# Patient Record
Sex: Male | Born: 1935
Health system: Southern US, Community
[De-identification: ages and names within clinical notes are randomized; demographics above are authoritative.]

## PROBLEM LIST (undated history)

## (undated) DIAGNOSIS — N400 Enlarged prostate without lower urinary tract symptoms: Secondary | ICD-10-CM

## (undated) DIAGNOSIS — H9190 Unspecified hearing loss, unspecified ear: Secondary | ICD-10-CM

## (undated) DIAGNOSIS — S7290XA Unspecified fracture of unspecified femur, initial encounter for closed fracture: Secondary | ICD-10-CM

## (undated) DIAGNOSIS — C61 Malignant neoplasm of prostate: Secondary | ICD-10-CM

## (undated) DIAGNOSIS — E785 Hyperlipidemia, unspecified: Secondary | ICD-10-CM

## (undated) DIAGNOSIS — N189 Chronic kidney disease, unspecified: Secondary | ICD-10-CM

## (undated) DIAGNOSIS — I10 Essential (primary) hypertension: Secondary | ICD-10-CM

## (undated) HISTORY — DX: Essential (primary) hypertension: I10

## (undated) HISTORY — PX: TONSILLECTOMY: SUR1361

## (undated) HISTORY — PX: CYSTOSCOPY: SUR368

## (undated) HISTORY — PX: PROSTATECTOMY: SHX69

## (undated) HISTORY — DX: Unspecified fracture of unspecified femur, initial encounter for closed fracture: S72.90XA

## (undated) HISTORY — PX: VERTEBROPLASTY: SHX113

## (undated) HISTORY — DX: Hyperlipidemia, unspecified: E78.5

## (undated) HISTORY — DX: Unspecified hearing loss, unspecified ear: H91.90

## (undated) HISTORY — DX: Chronic kidney disease, unspecified: N18.9

## (undated) HISTORY — PX: ORIF HIP FRACTURE: SHX2125

## (undated) HISTORY — DX: Malignant neoplasm of prostate: C61

## (undated) HISTORY — DX: Benign prostatic hyperplasia without lower urinary tract symptoms: N40.0

## (undated) HISTORY — PX: APPENDECTOMY: SHX54

---

## 1997-01-22 DIAGNOSIS — C61 Malignant neoplasm of prostate: Secondary | ICD-10-CM

## 1997-01-22 HISTORY — DX: Malignant neoplasm of prostate: C61

## 1997-08-03 ENCOUNTER — Other Ambulatory Visit: Admission: RE | Admit: 1997-08-03 | Discharge: 1997-08-03 | Payer: Self-pay | Admitting: Urology

## 1997-08-09 ENCOUNTER — Encounter: Admission: RE | Admit: 1997-08-09 | Discharge: 1997-11-07 | Payer: Self-pay | Admitting: *Deleted

## 1997-09-08 ENCOUNTER — Encounter: Payer: Self-pay | Admitting: Urology

## 1997-09-14 ENCOUNTER — Ambulatory Visit (HOSPITAL_COMMUNITY): Admission: RE | Admit: 1997-09-14 | Discharge: 1997-09-15 | Payer: Self-pay | Admitting: Urology

## 1997-09-14 ENCOUNTER — Encounter: Payer: Self-pay | Admitting: Urology

## 1999-09-15 ENCOUNTER — Other Ambulatory Visit: Admission: RE | Admit: 1999-09-15 | Discharge: 1999-09-15 | Payer: Self-pay | Admitting: Internal Medicine

## 1999-09-15 ENCOUNTER — Encounter (INDEPENDENT_AMBULATORY_CARE_PROVIDER_SITE_OTHER): Payer: Self-pay | Admitting: Specialist

## 2001-11-12 ENCOUNTER — Inpatient Hospital Stay (HOSPITAL_COMMUNITY): Admission: EM | Admit: 2001-11-12 | Discharge: 2001-11-16 | Payer: Self-pay | Admitting: Emergency Medicine

## 2001-11-12 ENCOUNTER — Encounter: Payer: Self-pay | Admitting: Family Medicine

## 2001-11-13 ENCOUNTER — Encounter: Payer: Self-pay | Admitting: Internal Medicine

## 2003-11-18 ENCOUNTER — Encounter: Admission: RE | Admit: 2003-11-18 | Discharge: 2003-11-18 | Payer: Self-pay | Admitting: Family Medicine

## 2003-11-29 ENCOUNTER — Ambulatory Visit: Payer: Self-pay | Admitting: Cardiology

## 2003-11-30 ENCOUNTER — Ambulatory Visit: Payer: Self-pay | Admitting: Family Medicine

## 2004-01-23 LAB — HM COLONOSCOPY

## 2004-01-27 ENCOUNTER — Ambulatory Visit: Payer: Self-pay | Admitting: Family Medicine

## 2004-03-10 ENCOUNTER — Ambulatory Visit: Payer: Self-pay | Admitting: Family Medicine

## 2004-06-06 ENCOUNTER — Encounter: Admission: RE | Admit: 2004-06-06 | Discharge: 2004-06-06 | Payer: Self-pay | Admitting: Urology

## 2004-06-12 ENCOUNTER — Ambulatory Visit (HOSPITAL_BASED_OUTPATIENT_CLINIC_OR_DEPARTMENT_OTHER): Admission: RE | Admit: 2004-06-12 | Discharge: 2004-06-12 | Payer: Self-pay | Admitting: Urology

## 2004-06-12 ENCOUNTER — Ambulatory Visit (HOSPITAL_COMMUNITY): Admission: RE | Admit: 2004-06-12 | Discharge: 2004-06-12 | Payer: Self-pay | Admitting: Urology

## 2004-11-22 ENCOUNTER — Ambulatory Visit: Payer: Self-pay | Admitting: Family Medicine

## 2004-11-22 LAB — FECAL OCCULT BLOOD, GUAIAC: Fecal Occult Blood: NEGATIVE

## 2004-11-22 LAB — CONVERTED CEMR LAB

## 2004-12-20 ENCOUNTER — Ambulatory Visit: Payer: Self-pay | Admitting: Family Medicine

## 2005-05-04 ENCOUNTER — Ambulatory Visit: Payer: Self-pay | Admitting: Family Medicine

## 2005-05-11 ENCOUNTER — Ambulatory Visit: Payer: Self-pay | Admitting: Family Medicine

## 2005-05-18 ENCOUNTER — Ambulatory Visit: Payer: Self-pay | Admitting: Family Medicine

## 2005-11-12 ENCOUNTER — Ambulatory Visit: Payer: Self-pay | Admitting: Family Medicine

## 2005-11-12 LAB — CONVERTED CEMR LAB
ALT: 19 units/L (ref 0–40)
AST: 18 units/L (ref 0–37)
Alkaline Phosphatase: 69 units/L (ref 39–117)
BUN: 23 mg/dL (ref 6–23)
Basophils Absolute: 0 10*3/uL (ref 0.0–0.1)
Basophils Relative: 0.6 % (ref 0.0–1.0)
Chol/HDL Ratio, serum: 5.4
Cholesterol: 165 mg/dL (ref 0–200)
Creatinine, Ser: 1.2 mg/dL (ref 0.4–1.5)
Eosinophil percent: 6.1 % — ABNORMAL HIGH (ref 0.0–5.0)
Glucose, Bld: 95 mg/dL (ref 70–99)
HCT: 43.9 % (ref 39.0–52.0)
HDL: 30.5 mg/dL — ABNORMAL LOW (ref >39.0)
Hemoglobin: 15.1 g/dL (ref 13.0–17.0)
LDL Cholesterol: 113 mg/dL — ABNORMAL HIGH (ref 0–99)
Lymphocytes Relative: 33.3 % (ref 12.0–46.0)
MCHC: 34.4 g/dL (ref 30.0–36.0)
MCV: 89.9 fL (ref 78.0–100.0)
Monocytes Absolute: 0.8 10*3/uL — ABNORMAL HIGH (ref 0.2–0.7)
Monocytes Relative: 11.5 % — ABNORMAL HIGH (ref 3.0–11.0)
Neutro Abs: 3.2 10*3/uL (ref 1.4–7.7)
Neutrophils Relative %: 48.5 % (ref 43.0–77.0)
Platelets: 263 10*3/uL (ref 150–400)
Potassium: 4.1 meq/L (ref 3.5–5.1)
RBC: 4.88 M/uL (ref 4.22–5.81)
RDW: 13.3 % (ref 11.5–14.6)
TSH: 2.35 microintl units/mL (ref 0.35–5.50)
Triglyceride fasting, serum: 107 mg/dL (ref 0–149)
VLDL: 21 mg/dL (ref 0–40)
WBC: 6.6 10*3/uL (ref 4.5–10.5)

## 2005-11-19 ENCOUNTER — Ambulatory Visit: Payer: Self-pay | Admitting: Family Medicine

## 2005-11-19 LAB — CONVERTED CEMR LAB
BUN: 17 mg/dL (ref 6–23)
Creatinine, Ser: 1.4 mg/dL (ref 0.4–1.5)
Potassium: 4.4 meq/L (ref 3.5–5.1)

## 2006-10-16 ENCOUNTER — Encounter: Payer: Self-pay | Admitting: Family Medicine

## 2006-10-16 DIAGNOSIS — N401 Enlarged prostate with lower urinary tract symptoms: Secondary | ICD-10-CM

## 2006-10-16 DIAGNOSIS — I499 Cardiac arrhythmia, unspecified: Secondary | ICD-10-CM | POA: Insufficient documentation

## 2006-10-16 DIAGNOSIS — I10 Essential (primary) hypertension: Secondary | ICD-10-CM

## 2006-10-16 DIAGNOSIS — L989 Disorder of the skin and subcutaneous tissue, unspecified: Secondary | ICD-10-CM | POA: Insufficient documentation

## 2006-10-16 DIAGNOSIS — Z8546 Personal history of malignant neoplasm of prostate: Secondary | ICD-10-CM | POA: Insufficient documentation

## 2006-10-16 DIAGNOSIS — R351 Nocturia: Secondary | ICD-10-CM

## 2006-11-29 ENCOUNTER — Ambulatory Visit: Payer: Self-pay | Admitting: Family Medicine

## 2006-11-29 LAB — CONVERTED CEMR LAB
ALT: 16 units/L (ref 0–53)
AST: 18 units/L (ref 0–37)
Albumin: 3.7 g/dL (ref 3.5–5.2)
Alkaline Phosphatase: 77 units/L (ref 39–117)
BUN: 27 mg/dL — ABNORMAL HIGH (ref 6–23)
Basophils Absolute: 0.1 10*3/uL (ref 0.0–0.1)
Basophils Relative: 1 % (ref 0.0–1.0)
Bilirubin Urine: NEGATIVE
Bilirubin, Direct: 0.1 mg/dL (ref 0.0–0.3)
Blood in Urine, dipstick: NEGATIVE
CO2: 29 meq/L (ref 19–32)
Calcium: 9.7 mg/dL (ref 8.4–10.5)
Chloride: 108 meq/L (ref 96–112)
Cholesterol: 172 mg/dL (ref 0–200)
Creatinine, Ser: 1.6 mg/dL — ABNORMAL HIGH (ref 0.4–1.5)
Eosinophils Absolute: 0.5 10*3/uL (ref 0.0–0.6)
Eosinophils Relative: 6 % — ABNORMAL HIGH (ref 0.0–5.0)
GFR calc Af Amer: 53 mL/min
GFR calc non Af Amer: 44 mL/min
Glucose, Bld: 133 mg/dL — ABNORMAL HIGH (ref 70–99)
Glucose, Urine, Semiquant: NEGATIVE
HCT: 43.3 % (ref 39.0–52.0)
HDL: 25.3 mg/dL — ABNORMAL LOW (ref >39.0)
Hemoglobin: 15 g/dL (ref 13.0–17.0)
Ketones, urine, test strip: NEGATIVE
LDL Cholesterol: 108 mg/dL — ABNORMAL HIGH (ref 0–99)
Lymphocytes Relative: 31.6 % (ref 12.0–46.0)
MCHC: 34.5 g/dL (ref 30.0–36.0)
MCV: 91.4 fL (ref 78.0–100.0)
Monocytes Absolute: 1 10*3/uL — ABNORMAL HIGH (ref 0.2–0.7)
Monocytes Relative: 12.1 % — ABNORMAL HIGH (ref 3.0–11.0)
Neutro Abs: 4.1 10*3/uL (ref 1.4–7.7)
Neutrophils Relative %: 49.3 % (ref 43.0–77.0)
Nitrite: NEGATIVE
PSA: 0.03 ng/mL — ABNORMAL LOW (ref 0.10–4.00)
Platelets: 255 10*3/uL (ref 150–400)
Potassium: 4.5 meq/L (ref 3.5–5.1)
Protein, U semiquant: NEGATIVE
RBC: 4.74 M/uL (ref 4.22–5.81)
RDW: 13.1 % (ref 11.5–14.6)
Sodium: 145 meq/L (ref 135–145)
Specific Gravity, Urine: 1.015
TSH: 2.42 microintl units/mL (ref 0.35–5.50)
Total Bilirubin: 1 mg/dL (ref 0.3–1.2)
Total CHOL/HDL Ratio: 6.8
Total Protein: 6.6 g/dL (ref 6.0–8.3)
Triglycerides: 194 mg/dL — ABNORMAL HIGH (ref 0–149)
Urobilinogen, UA: 0.2
VLDL: 39 mg/dL (ref 0–40)
WBC Urine, dipstick: NEGATIVE
WBC: 8.4 10*3/uL (ref 4.5–10.5)
pH: 5

## 2006-12-04 ENCOUNTER — Encounter: Payer: Self-pay | Admitting: Family Medicine

## 2007-01-27 ENCOUNTER — Ambulatory Visit: Payer: Self-pay | Admitting: Family Medicine

## 2007-10-23 ENCOUNTER — Ambulatory Visit: Payer: Self-pay | Admitting: Family Medicine

## 2007-12-02 ENCOUNTER — Ambulatory Visit: Payer: Self-pay | Admitting: Family Medicine

## 2008-01-28 ENCOUNTER — Encounter: Payer: Self-pay | Admitting: Family Medicine

## 2008-01-29 ENCOUNTER — Ambulatory Visit: Payer: Self-pay | Admitting: Family Medicine

## 2008-01-29 DIAGNOSIS — M109 Gout, unspecified: Secondary | ICD-10-CM

## 2008-02-02 LAB — CONVERTED CEMR LAB
ALT: 14 units/L (ref 0–53)
AST: 19 units/L (ref 0–37)
Albumin: 3.5 g/dL (ref 3.5–5.2)
Alkaline Phosphatase: 80 units/L (ref 39–117)
BUN: 22 mg/dL (ref 6–23)
Basophils Absolute: 0.1 10*3/uL (ref 0.0–0.1)
Basophils Relative: 0.9 % (ref 0.0–3.0)
Bilirubin, Direct: 0.1 mg/dL (ref 0.0–0.3)
CO2: 29 meq/L (ref 19–32)
Calcium: 9.3 mg/dL (ref 8.4–10.5)
Chloride: 109 meq/L (ref 96–112)
Cholesterol: 169 mg/dL (ref 0–200)
Creatinine, Ser: 1.5 mg/dL (ref 0.4–1.5)
Eosinophils Absolute: 1.2 10*3/uL — ABNORMAL HIGH (ref 0.0–0.7)
Eosinophils Relative: 12 % — ABNORMAL HIGH (ref 0.0–5.0)
GFR calc Af Amer: 57 mL/min
GFR calc non Af Amer: 47 mL/min
Glucose, Bld: 111 mg/dL — ABNORMAL HIGH (ref 70–99)
HCT: 43.1 % (ref 39.0–52.0)
HDL: 29.8 mg/dL — ABNORMAL LOW (ref >39.0)
Hemoglobin: 14.9 g/dL (ref 13.0–17.0)
LDL Cholesterol: 111 mg/dL — ABNORMAL HIGH (ref 0–99)
Lymphocytes Relative: 22 % (ref 12.0–46.0)
MCHC: 34.5 g/dL (ref 30.0–36.0)
MCV: 91.7 fL (ref 78.0–100.0)
Monocytes Absolute: 1.3 10*3/uL — ABNORMAL HIGH (ref 0.1–1.0)
Monocytes Relative: 13.5 % — ABNORMAL HIGH (ref 3.0–12.0)
Neutro Abs: 5.1 10*3/uL (ref 1.4–7.7)
Neutrophils Relative %: 51.6 % (ref 43.0–77.0)
Platelets: 286 10*3/uL (ref 150–400)
Potassium: 4.1 meq/L (ref 3.5–5.1)
RBC: 4.7 M/uL (ref 4.22–5.81)
RDW: 13.4 % (ref 11.5–14.6)
Sodium: 147 meq/L — ABNORMAL HIGH (ref 135–145)
TSH: 2.67 microintl units/mL (ref 0.35–5.50)
Total Bilirubin: 0.7 mg/dL (ref 0.3–1.2)
Total CHOL/HDL Ratio: 5.7
Total Protein: 6.9 g/dL (ref 6.0–8.3)
Triglycerides: 142 mg/dL (ref 0–149)
Uric Acid, Serum: 10.1 mg/dL — ABNORMAL HIGH (ref 4.0–7.8)
VLDL: 28 mg/dL (ref 0–40)
WBC: 9.9 10*3/uL (ref 4.5–10.5)

## 2008-10-21 ENCOUNTER — Ambulatory Visit: Payer: Self-pay | Admitting: Family Medicine

## 2009-01-31 ENCOUNTER — Ambulatory Visit: Payer: Self-pay | Admitting: Family Medicine

## 2009-03-03 ENCOUNTER — Encounter
Admission: RE | Admit: 2009-03-03 | Discharge: 2009-03-03 | Payer: Self-pay | Admitting: Physical Medicine and Rehabilitation

## 2009-03-08 ENCOUNTER — Encounter
Admission: RE | Admit: 2009-03-08 | Discharge: 2009-03-08 | Payer: Self-pay | Admitting: Physical Medicine and Rehabilitation

## 2009-03-18 ENCOUNTER — Encounter: Payer: Self-pay | Admitting: Family Medicine

## 2009-03-26 ENCOUNTER — Emergency Department (HOSPITAL_COMMUNITY)
Admission: EM | Admit: 2009-03-26 | Discharge: 2009-03-26 | Payer: Self-pay | Source: Home / Self Care | Admitting: Emergency Medicine

## 2009-04-04 ENCOUNTER — Inpatient Hospital Stay (HOSPITAL_COMMUNITY): Admission: EM | Admit: 2009-04-04 | Discharge: 2009-04-08 | Payer: Self-pay | Admitting: Emergency Medicine

## 2009-04-11 ENCOUNTER — Encounter: Payer: Self-pay | Admitting: Emergency Medicine

## 2009-04-12 ENCOUNTER — Inpatient Hospital Stay (HOSPITAL_COMMUNITY): Admission: EM | Admit: 2009-04-12 | Discharge: 2009-04-18 | Payer: Self-pay

## 2009-04-12 ENCOUNTER — Ambulatory Visit: Payer: Self-pay | Admitting: Cardiology

## 2009-06-02 DIAGNOSIS — N200 Calculus of kidney: Secondary | ICD-10-CM | POA: Insufficient documentation

## 2009-06-02 DIAGNOSIS — E785 Hyperlipidemia, unspecified: Secondary | ICD-10-CM

## 2009-06-02 DIAGNOSIS — N19 Unspecified kidney failure: Secondary | ICD-10-CM | POA: Insufficient documentation

## 2009-06-03 ENCOUNTER — Ambulatory Visit: Payer: Self-pay | Admitting: Cardiology

## 2009-06-08 ENCOUNTER — Telehealth (INDEPENDENT_AMBULATORY_CARE_PROVIDER_SITE_OTHER): Payer: Self-pay | Admitting: *Deleted

## 2009-06-09 ENCOUNTER — Ambulatory Visit: Payer: Self-pay

## 2009-06-09 ENCOUNTER — Ambulatory Visit: Payer: Self-pay | Admitting: Internal Medicine

## 2009-06-09 ENCOUNTER — Encounter (HOSPITAL_COMMUNITY): Admission: RE | Admit: 2009-06-09 | Discharge: 2009-08-23 | Payer: Self-pay | Admitting: Cardiology

## 2009-08-17 ENCOUNTER — Encounter: Payer: Self-pay | Admitting: Cardiology

## 2009-08-17 ENCOUNTER — Ambulatory Visit: Payer: Self-pay | Admitting: Cardiology

## 2009-11-07 ENCOUNTER — Ambulatory Visit: Payer: Self-pay | Admitting: Family Medicine

## 2010-02-10 ENCOUNTER — Ambulatory Visit
Admission: RE | Admit: 2010-02-10 | Discharge: 2010-02-10 | Payer: Self-pay | Source: Home / Self Care | Attending: Family Medicine | Admitting: Family Medicine

## 2010-02-10 ENCOUNTER — Other Ambulatory Visit: Payer: Self-pay | Admitting: Family Medicine

## 2010-02-10 LAB — CBC WITH DIFFERENTIAL/PLATELET
Basophils Absolute: 0.1 10*3/uL (ref 0.0–0.1)
Basophils Relative: 0.9 % (ref 0.0–3.0)
Eosinophils Absolute: 0.5 10*3/uL (ref 0.0–0.7)
Eosinophils Relative: 5.7 % — ABNORMAL HIGH (ref 0.0–5.0)
HCT: 43.8 % (ref 39.0–52.0)
Hemoglobin: 14.7 g/dL (ref 13.0–17.0)
Lymphocytes Relative: 27.8 % (ref 12.0–46.0)
Lymphs Abs: 2.3 10*3/uL (ref 0.7–4.0)
MCHC: 33.7 g/dL (ref 30.0–36.0)
MCV: 93.7 fl (ref 78.0–100.0)
Monocytes Absolute: 0.8 10*3/uL (ref 0.1–1.0)
Monocytes Relative: 9.4 % (ref 3.0–12.0)
Neutro Abs: 4.6 10*3/uL (ref 1.4–7.7)
Neutrophils Relative %: 56.2 % (ref 43.0–77.0)
Platelets: 238 10*3/uL (ref 150.0–400.0)
RBC: 4.68 Mil/uL (ref 4.22–5.81)
RDW: 14.3 % (ref 11.5–14.6)
WBC: 8.2 10*3/uL (ref 4.5–10.5)

## 2010-02-10 LAB — HEPATIC FUNCTION PANEL
ALT: 14 U/L (ref 0–53)
AST: 18 U/L (ref 0–37)
Albumin: 3.8 g/dL (ref 3.5–5.2)
Alkaline Phosphatase: 86 U/L (ref 39–117)
Bilirubin, Direct: 0.1 mg/dL (ref 0.0–0.3)
Total Bilirubin: 0.6 mg/dL (ref 0.3–1.2)
Total Protein: 6.5 g/dL (ref 6.0–8.3)

## 2010-02-10 LAB — CONVERTED CEMR LAB
Blood in Urine, dipstick: NEGATIVE
Glucose, Urine, Semiquant: NEGATIVE
Nitrite: NEGATIVE
Specific Gravity, Urine: 1.02
Urobilinogen, UA: 0.2
WBC Urine, dipstick: NEGATIVE
pH: 5.5

## 2010-02-10 LAB — BASIC METABOLIC PANEL
BUN: 32 mg/dL — ABNORMAL HIGH (ref 6–23)
CO2: 29 mEq/L (ref 19–32)
Calcium: 9.3 mg/dL (ref 8.4–10.5)
Chloride: 107 mEq/L (ref 96–112)
Creatinine, Ser: 1.4 mg/dL (ref 0.4–1.5)
GFR: 48.82 mL/min — ABNORMAL LOW (ref >60.00)
Glucose, Bld: 100 mg/dL — ABNORMAL HIGH (ref 70–99)
Potassium: 4.1 mEq/L (ref 3.5–5.1)
Sodium: 145 mEq/L (ref 135–145)

## 2010-02-10 LAB — TSH: TSH: 3.18 u[IU]/mL (ref 0.35–5.50)

## 2010-02-16 ENCOUNTER — Encounter: Payer: Self-pay | Admitting: Family Medicine

## 2010-02-18 ENCOUNTER — Encounter: Payer: Self-pay | Admitting: Family Medicine

## 2010-02-19 LAB — CONVERTED CEMR LAB
ALT: 19 units/L (ref 0–53)
AST: 23 units/L (ref 0–37)
Albumin: 3.5 g/dL (ref 3.5–5.2)
Alkaline Phosphatase: 71 units/L (ref 39–117)
BUN: 21 mg/dL (ref 6–23)
Basophils Absolute: 0 10*3/uL (ref 0.0–0.1)
Basophils Relative: 0.5 % (ref 0.0–3.0)
Bilirubin Urine: NEGATIVE
Bilirubin, Direct: 0 mg/dL (ref 0.0–0.3)
Blood in Urine, dipstick: NEGATIVE
CO2: 27 meq/L (ref 19–32)
Calcium: 9.1 mg/dL (ref 8.4–10.5)
Chloride: 113 meq/L — ABNORMAL HIGH (ref 96–112)
Creatinine, Ser: 1.4 mg/dL (ref 0.4–1.5)
Eosinophils Absolute: 0.4 10*3/uL (ref 0.0–0.7)
Eosinophils Relative: 6.3 % — ABNORMAL HIGH (ref 0.0–5.0)
GFR calc non Af Amer: 50.55 mL/min (ref >60)
Glucose, Bld: 113 mg/dL — ABNORMAL HIGH (ref 70–99)
Glucose, Urine, Semiquant: NEGATIVE
HCT: 41.9 % (ref 39.0–52.0)
Hemoglobin: 13.8 g/dL (ref 13.0–17.0)
Ketones, urine, test strip: NEGATIVE
Lymphocytes Relative: 29 % (ref 12.0–46.0)
Lymphs Abs: 1.9 10*3/uL (ref 0.7–4.0)
MCHC: 32.9 g/dL (ref 30.0–36.0)
MCV: 95.6 fL (ref 78.0–100.0)
Monocytes Absolute: 0.6 10*3/uL (ref 0.1–1.0)
Monocytes Relative: 9 % (ref 3.0–12.0)
Neutro Abs: 3.8 10*3/uL (ref 1.4–7.7)
Neutrophils Relative %: 55.2 % (ref 43.0–77.0)
Nitrite: NEGATIVE
Platelets: 220 10*3/uL (ref 150.0–400.0)
Potassium: 4.5 meq/L (ref 3.5–5.1)
RBC: 4.38 M/uL (ref 4.22–5.81)
RDW: 14.6 % (ref 11.5–14.6)
Sodium: 146 meq/L — ABNORMAL HIGH (ref 135–145)
Specific Gravity, Urine: 1.025
TSH: 3.47 microintl units/mL (ref 0.35–5.50)
Total Bilirubin: 0.7 mg/dL (ref 0.3–1.2)
Total Protein: 7 g/dL (ref 6.0–8.3)
Uric Acid, Serum: 7.2 mg/dL (ref 4.0–7.8)
Urobilinogen, UA: 0.2
WBC Urine, dipstick: NEGATIVE
WBC: 6.7 10*3/uL (ref 4.5–10.5)
pH: 5

## 2010-02-21 NOTE — Progress Notes (Signed)
Summary: Nuclear Pre-Procedure  Phone Note Outgoing Call   Call placed by: Milana Na, EMT-P,  Jun 08, 2009 4:28 PM Summary of Call: Reviewed information on Myoview Information Sheet (see scanned document for further details).  Spoke with patient.     Nuclear Med Background Indications for Stress Test: Evaluation for Ischemia     Symptoms: Chest Pain with Exertion, DOE    Nuclear Pre-Procedure Cardiac Risk Factors: Hypertension, Lipids Height (in): 70  Nuclear Med Study Referring MD:  T.Gollan

## 2010-02-21 NOTE — Letter (Signed)
Summary: Application for Handicapped Drivers Registration Plate  Application for Handicapped Drivers Registration Plate   Imported By: Maryln Gottron 03/18/2009 12:23:15  _____________________________________________________________________  External Attachment:    Type:   Image     Comment:   External Document

## 2010-02-21 NOTE — Assessment & Plan Note (Signed)
Summary: EMP-WILL FAST//CCM   Vital Signs:  Patient profile:   77 year old male Weight:      174 pounds Temp:     98.7 degrees F BP sitting:   160 / 78  (left arm) Cuff size:   regular  Vitals Entered By: Kern Reap CMA Duncan Dull) (January 31, 2009 8:33 AM)  Reason for Visit cpx  History of Present Illness: Mark Velasquez is a 77 year old, single male, retired Retail buyer, who comes in today for evaluation of multiple issues.  Is a history of underlying gout, but is not taking his allopurinol on a regular basis.  Despite not taking his medication.  He said no flares of gout.  He also has underlying hypertension, for which he takes Zestril, 10 mg daily, acebutolol 400 mg daily, Lasix 40 mg in the morning and 20 mg at noon, and one, potassium supplement daily, 20 mEq.  His blood pressures at home are normal.  He states the noon dose of Lasix causes him to pee all day out long and he would like to just take the morning dose and not take the noon dose.  Advised to hold the noon dose check her BP every morning.  If no increase in blood pressure or per for edema.  Just take one dose daily.  He also takes a multivitamin for macular degeneration.  He gets routine eye care, dental care, colonoscopy done in GI at age 40.  He needs no further colonoscopy screening.  Tetanus 2004, flu, 2010, Pneumovax 2005.  He had the disease, shingles, and, therefore, does not want the vaccination.  Despite that information given.  His son has his healthcare power of attorney, and he has a living will.  Both issues were reviewed in detail.  He desires no artificial means of life support feeding tubes ventilators etc.  Allergies: 1)  ! * Ivp Dye  Past History:  Past medical, surgical, family and social histories (including risk factors) reviewed, and no changes noted (except as noted below).  Past Medical History: Reviewed history from 01/29/2008 and no changes required. cardiac arrythmia Prostate cancer, hx  of Hypertension Benign prostatic hypertrophy cystic lesion L scrotum Gout  Past Surgical History: Reviewed history from 10/16/2006 and no changes required. Prostatectomy outlet obstruction  2006  Family History: Reviewed history from 01/27/2007 and no changes required. father died of a stroke and underlying Parkinson's disease .  Mom died of pneumonia and high blood pressure  Social History: Reviewed history from 01/27/2007 and no changes required. Retired Product/process development scientist Never Smoked Alcohol use-no Drug use-no Regular exercise-yes  Review of Systems      See HPI  Physical Exam  General:  Well-developed,well-nourished,in no acute distress; alert,appropriate and cooperative throughout examination Head:  Normocephalic and atraumatic without obvious abnormalities. No apparent alopecia or balding. Eyes:  No corneal or conjunctival inflammation noted. EOMI. Perrla. Funduscopic exam benign, without hemorrhages, exudates or papilledema. Vision grossly normal.bilateral lens implants Ears:  External ear exam shows no significant lesions or deformities.  Otoscopic examination reveals clear canals, tympanic membranes are intact bilaterally without bulging, retraction, inflammation or discharge. Hearing is grossly normal bilaterally. Nose:  External nasal examination shows no deformity or inflammation. Nasal mucosa are pink and moist without lesions or exudates. Mouth:  Oral mucosa and oropharynx without lesions or exudates.  Teeth in good repair. Neck:  No deformities, masses, or tenderness noted. Chest Wall:  No deformities, masses, tenderness or gynecomastia noted. Breasts:  No masses or gynecomastia noted Lungs:  Normal  respiratory effort, chest expands symmetrically. Lungs are clear to auscultation, no crackles or wheezes. Heart:  Normal rate and regular rhythm. S1 and S2 normal without gallop, murmur, click, rub or other extra sounds. Abdomen:  Bowel sounds positive,abdomen soft and  non-tender without masses, organomegaly or hernias noted. Rectal:  uro Genitalia:  uro Prostate:  uro Msk:  No deformity or scoliosis noted of thoracic or lumbar spine.   Pulses:  R and L carotid,radial,femoral,dorsalis pedis and posterior tibial pulses are full and equal bilaterally Extremities:  No clubbing, cyanosis, edema, or deformity noted with normal full range of motion of all joints.   Neurologic:  No cranial nerve deficits noted. Station and gait are normal. Plantar reflexes are down-going bilaterally. DTRs are symmetrical throughout. Sensory, motor and coordinative functions appear intact. Skin:  total body skin exam normal.  He has hundreds of seborrheic keratoses Cervical Nodes:  No lymphadenopathy noted Axillary Nodes:  No palpable lymphadenopathy Inguinal Nodes:  No significant adenopathy Psych:  Cognition and judgment appear intact. Alert and cooperative with normal attention span and concentration. No apparent delusions, illusions, hallucinations   Impression & Recommendations:  Problem # 1:  GOUT (ICD-274.9) Assessment Unchanged  His updated medication list for this problem includes:    Allopurinol 300 Mg Tabs (Allopurinol) ..... Once daily as needed  Orders: Venipuncture (16109) UA Dipstick w/o Micro (automated)  (81003) Prescription Created Electronically [phone]) TLB-BMP (Basic Metabolic Panel-BMET) (80048-METABOL) TLB-CBC Platelet - w/Differential (85025-CBCD) TLB-Hepatic/Liver Function Pnl (80076-HEPATIC) TLB-TSH (Thyroid Stimulating Hormone) (84443-TSH) TLB-Uric Acid, Blood (84550-URIC)  Problem # 2:  SKIN LESION (ICD-709.9) Assessment: Improved  Problem # 3:  HYPERTENSION (ICD-401.9) Assessment: Improved  His updated medication list for this problem includes:    Lasix 40 Mg Tabs (Furosemide) .Marland Kitchen... 1 every morning 1/2 at noon daily    Zestril 10 Mg Tabs (Lisinopril) ..... Every morning    Acebutolol Hcl 400 Mg Caps (Acebutolol hcl) .Marland Kitchen... Take 1 tablet  by mouth every morning  Orders: Venipuncture (09811) UA Dipstick w/o Micro (automated)  (81003) Prescription Created Electronically [phone]) EKG w/ Interpretation (93000) TLB-BMP (Basic Metabolic Panel-BMET) (80048-METABOL) TLB-CBC Platelet - w/Differential (85025-CBCD) TLB-Hepatic/Liver Function Pnl (80076-HEPATIC) TLB-TSH (Thyroid Stimulating Hormone) (84443-TSH) TLB-Uric Acid, Blood (84550-URIC)  Complete Medication List: 1)  Allopurinol 300 Mg Tabs (Allopurinol) .... Once daily as needed 2)  Fish Oil Oil (Fish oil) .... Take 1 tablet by mouth once a day 3)  Potassium Chloride 20 Meq Pack (Potassium chloride) .... Every morning 4)  Lasix 40 Mg Tabs (Furosemide) .Marland Kitchen.. 1 every morning 1/2 at noon daily 5)  Zestril 10 Mg Tabs (Lisinopril) .... Every morning 6)  Niacin Cr 500 Mg Tbcr (Niacin) .... Take 1 tablet by mouth once a day 7)  Acebutolol Hcl 400 Mg Caps (Acebutolol hcl) .... Take 1 tablet by mouth every morning 8)  Adult Aspirin Ec Low Strength 81 Mg Tbec (Aspirin) .... Once daily  Patient Instructions: 1)  continue the morning dose of Lasix.  Hold the noon dose of Lasix.  Checky r blood pressure daily for two weeks.  If you C. no increase in blood pressure or edema,  Continue to hold the new dose.  If, however, u  noticed increased blood pressure and or edema of the legs restart the noon dose of Lasix 2)  Please schedule a follow-up appointment in 1 year. Prescriptions: ACEBUTOLOL HCL 400 MG  CAPS (ACEBUTOLOL HCL) Take 1 tablet by mouth every morning  #100 x 3   Entered and Authorized by:  Roderick Pee MD   Signed by:   Roderick Pee MD on 01/31/2009   Method used:   Electronically to        [facility]  [phone]* (retail)       [address] [place], [place]  [address]       Ph: [phone] or [phone]       Fax: [phone]   RxID:   [id number] ZESTRIL 10 MG  TABS (LISINOPRIL) every morning  #100 x 3   Entered and Authorized by:   Roderick Pee  MD   Signed by:   Roderick Pee MD on 01/31/2009   Method used:   Electronically to        [facility]  [phone]* (retail)       [address] [place], [place]  [address]       Ph: [phone] or [phone]       Fax: [phone]   RxID:   [id number] LASIX 40 MG  TABS (FUROSEMIDE) 1 every morning 1/2 at noon daily  #150 x 3   Entered and Authorized by:   Roderick Pee MD   Signed by:   Roderick Pee MD on 01/31/2009   Method used:   Electronically to        [facility]  [phone]* (retail)       [address] [place], [place]  [address]       Ph: [phone] or [phone]       Fax: [phone]   RxID:   [id number] POTASSIUM CHLORIDE 20 MEQ  PACK (POTASSIUM CHLORIDE) every morning  #100 Tablet x 3   Entered and Authorized by:   Roderick Pee MD   Signed by:   Roderick Pee MD on 01/31/2009   Method used:   Electronically to        [facility]  [phone]* (retail)       [address] [place], [place]  [address]       Ph: [phone] or [phone]       Fax: [phone]   RxID:   [id number] ALLOPURINOL 300 MG  TABS (ALLOPURINOL) once daily as needed  #100 x 3   Entered and Authorized by:   Roderick Pee MD   Signed by:   Roderick Pee MD on 01/31/2009   Method used:   Electronically to        [facility]  [phone]* (retail)       [address] [place], [place]  [address]       Ph: [phone] or [phone]       Fax: [phone]   RxID:   [id number]     Laboratory Results   Urine Tests    Routine Urinalysis   Color: yellow Appearance: Clear Glucose: negative   (Normal Range: Negative) Bilirubin: negative   (Normal Range: Negative) Ketone: negative   (Normal Range: Negative) Spec. Gravity: 1.025   (Normal Range: 1.003-1.035) Blood: negative   (Normal Range: Negative) pH: 5.0   (Normal Range: 5.0-8.0) Protein: 1+   (Normal Range: Negative) Urobilinogen: 0.2   (Normal Range:  0-1) Nitrite: negative   (Normal Range: Negative) Leukocyte Esterace: negative   (Normal Range: Negative)    Comments: Rita Ohara  January 31, 2009  10:05 AM

## 2010-02-21 NOTE — Assessment & Plan Note (Signed)
Summary: [facility]   Visit Type:  Follow-up  CC:  discuss myoview.  History of Present Illness: 77 year old male recently admitted in March 2011 with urosepsis. Cardiac markers checked for unclear reasons. Patient was having no symptoms. His troponin was mildly elevated at 0.51, 0.53 and 0.25 but we elected to treat medically.  Note he had pseudomonas in his blood. I last saw him in May of 2011. We scheduled a Myoview which was also performed in May of 2011. This showed an ejection fraction of 60%. There was mild infero-apical ischemia. We have treated medically. Since I last saw him the patient has dyspnea with more extreme activities but not with routine activities. It is relieved with rest. It is not associated with chest pain. There is no orthopnea, PND or pedal edema. There is no syncope or palpitations. There is no exertional chest pain.   Current Medications (verified): 1)  Fish Oil   Oil (Fish Oil) .... Take 1 Tablet By Mouth Two Times A Day 2)  Potassium Chloride 20 Meq  Pack (Potassium Chloride) .... Every Morning 3)  Lasix 40 Mg  Tabs (Furosemide) .... Take 1 Tablet By Mouth Once A Day 4)  Zestril 10 Mg  Tabs (Lisinopril) .... Every Morning 5)  Niaspan 1000 Mg Cr-Tabs (Niacin (Antihyperlipidemic)) .Marland Kitchen.. 1 Tab By Mouth Once Daily 6)  Sectral 400 Mg Caps (Acebutolol Hcl) .Marland Kitchen.. 1 Tab By Mouth Once Daily 7)  Ferrous Sulfate 325 (65 Fe) Mg  Tabs (Ferrous Sulfate) .Marland Kitchen.. 1 Tab By Mouth Once Daily 8)  Vitamin C 250 Mg Tabs (Ascorbic Acid) .Marland Kitchen.. 1 Tab By Mouth Once Daily 9)  Colace 100 Mg Caps (Docusate Sodium) .... As Needed 10)  Oyst-Cal-D 500 500-200 Mg-Unit Tabs (Calcium Carbonate-Vitamin D) .Marland Kitchen.. 1 Tab By Mouth Three Times A Day 11)  Metoprolol Succinate 50 Mg Xr24h-Tab (Metoprolol Succinate) .... Take One Tablet By Mouth Daily 12)  Tamsulosin Hcl 0.4 Mg Caps (Tamsulosin Hcl) .Marland Kitchen.. 1 Tab By Mouth Once Daily 13)  Miralax  Powd (Polyethylene Glycol 3350) .... As Needed 14)  Norco  5-325 Mg Tabs (Hydrocodone-Acetaminophen) .... As Needed 15)  Aspirin 81 Mg Tbec (Aspirin) .... Take One Tablet By Mouth Daily  Allergies: 1)  ! * Ivp Dye  Past History:  Past Medical History: Reviewed history from 06/03/2009 and no changes required. HYPERTENSION (ICD-401.9) DYSLIPIDEMIA (ICD-272.4) NEPHROLITHIASIS (ICD-592.0) RENAL FAILURE (ICD-586) GOUT (ICD-274.9) BENIGN PROSTATIC HYPERTROPHY (ICD-600.00) PROSTATE CANCER, HX OF (ICD-V10.46) cystic lesion L scrotum Prior hip fracture  Past Surgical History: Reviewed history from 06/02/2009 and no changes required.  He is status post vertebroplasty for his  compression fracture as well as right ORIF for his hip fracture  prostatectomy  tonsillectomy  appendectomy  colonoscopy x2 as well as cystoscopy with dilatation and laser surgery on his bladder neck for   the contracture.      Social History: Reviewed history from 01/27/2007 and no changes required. Retired Product/process development scientist Never Smoked Alcohol use-no Drug use-no Regular exercise-yes  Review of Systems       Arthralgias and difficulty controlling urine but no fevers or chills, productive cough, hemoptysis, dysphasia, odynophagia, melena, hematochezia, dysuria, hematuria, rash, seizure activity, orthopnea, PND, pedal edema, claudication. Remaining systems are negative.   Vital Signs:  Patient profile:   77 year old male Height:      70 inches Weight:      167.75 pounds BMI:     24.16 Pulse rate:   68 / minute Pulse rhythm:   regular  Resp:     18 per minute BP sitting:   150 / 64  (left arm) Cuff size:   large  Vitals Entered By: Vikki Ports (August 17, 2009 2:51 PM)  Physical Exam  General:  Well-developed well-nourished in no acute distress.  Skin is warm and dry.  HEENT is normal.  Neck is supple. No thyromegaly.  Chest is clear to auscultation with normal expansion.  Cardiovascular exam is regular rate and rhythm.  Abdominal exam nontender or distended.  No masses palpated. Extremities show no edema. neuro grossly intact    Impression & Recommendations:  Problem # 1:  CARDIOVASCULAR FUNCTION STUDY, ABNORMAL (ICD-794.30) I have reviewed the patient's nuclear study with him. There is very mild ischemia at the apex. Overall I feel that it is a low risk study. He is not chest pain. I therefore feel that medical therapy is warranted. He will continue on his aspirin and beta blocker. If he has chest pain in the future then we can reconsider this approach.  Problem # 2:  HYPERTENSION (ICD-401.9) Blood pressure mildly elevated. We will follow this and increase as needed. The following medications were removed from the medication list:    Acebutolol Hcl 400 Mg Caps (Acebutolol hcl) .Marland Kitchen... Take 1 tablet by mouth every morning    Aspirin Ec 325 Mg Tbec (Aspirin) .Marland Kitchen... 1 tab by mouth two times a day His updated medication list for this problem includes:    Lasix 40 Mg Tabs (Furosemide) .Marland Kitchen... Take 1 tablet by mouth once a day    Zestril 10 Mg Tabs (Lisinopril) ..... Every morning    Sectral 400 Mg Caps (Acebutolol hcl) .Marland Kitchen... 1 tab by mouth once daily    Metoprolol Succinate 50 Mg Xr24h-tab (Metoprolol succinate) .Marland Kitchen... Take one tablet by mouth daily    Aspirin 81 Mg Tbec (Aspirin) .Marland Kitchen... Take one tablet by mouth daily  Problem # 3:  DYSLIPIDEMIA (ICD-272.4) Continue present medications. Lipids and liver monitor her primary care. His updated medication list for this problem includes:    Niaspan 1000 Mg Cr-tabs (Niacin (antihyperlipidemic)) .Marland Kitchen... 1 tab by mouth once daily  Problem # 4:  BENIGN PROSTATIC HYPERTROPHY (ICD-600.00)  Patient Instructions: 1)  Your physician recommends that you schedule a follow-up appointment in: YEAR WITH DR Jens Som  IN [place] 2)  Your physician recommends that you continue on your current medications as directed. Please refer to the Current Medication list given to you today.

## 2010-02-21 NOTE — Assessment & Plan Note (Signed)
Summary: swelling in groin area/njr   Vital Signs:  Patient profile:   77 year old male Weight:      166 pounds Temp:     98.0 degrees F oral BP sitting:   140 / 80  (left arm) Cuff size:   regular  Vitals Entered By: Kern Reap CMA Duncan Dull) (November 07, 2009 10:01 AM) CC: swelling in groin area Is Patient Diabetic? No   CC:  swelling in groin area.  History of Present Illness: Mark Velasquez is a 77 year old male, who comes in today for evaluation of two problems.  He has had discomfort in the right groin area for 4 months.  It started after he resumed ambulation from his hip fracture.  His orthopedist feels like is related to the hip, but he would like another opinion.  Review of systems negative.  He also has a history of macular degeneration and would like a referral to see Dr. Chilton Si at [facility]  Allergies: 1)  ! * Ivp Dye  Past History:  Past medical, surgical, family and social histories (including risk factors) reviewed for relevance to current acute and chronic problems.  Past Medical History: Reviewed history from 06/03/2009 and no changes required. HYPERTENSION (ICD-401.9) DYSLIPIDEMIA (ICD-272.4) NEPHROLITHIASIS (ICD-592.0) RENAL FAILURE (ICD-586) GOUT (ICD-274.9) BENIGN PROSTATIC HYPERTROPHY (ICD-600.00) PROSTATE CANCER, HX OF (ICD-V10.46) cystic lesion L scrotum Prior hip fracture  Past Surgical History: Reviewed history from 06/02/2009 and no changes required.  He is status post vertebroplasty for his  compression fracture as well as right ORIF for his hip fracture  prostatectomy  tonsillectomy  appendectomy  colonoscopy x2 as well as cystoscopy with dilatation and laser surgery on his bladder neck for   the contracture.      Family History: Reviewed history from 01/27/2007 and no changes required. father died of a stroke and underlying Parkinson's disease .  Mom died of pneumonia and high blood pressure  Social  History: Reviewed history from 01/27/2007 and no changes required. 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 no    Any prior history of allergic reactions to egg and/or gelatin? no    Do you have a sensitivity to the preservative Thimersol? no    Do you have a past history of Guillan-Barre Syndrome? no    Do you currently have an acute febrile illness? no    Have you ever had a severe reaction to latex? no    Vaccine information given and explained to patient? yes    Are you currently pregnant? no    Lot Number:[id number]   Exp Date:07/22/2010   Site Given  Left Deltoid IM   Physical Exam  General:  Well-developed,well-nourished,in no acute distress; alert,appropriate and cooperative throughout examination Abdomen:  Bowel sounds positive,abdomen soft and non-tender without masses, organomegaly or hernias noted. Genitalia:  Testes bilaterally descended without nodularity, tenderness or masses. No scrotal masses or lesions. No penis lesions or urethral discharge.   Impression & Recommendations:  Problem # 1:  RLQ PAIN (ICD-789.03) Assessment New  Complete Medication List: 1)  Fish Oil Oil (Fish oil) .... Take 1 tablet by mouth two times a day 2)  Potassium Chloride 20 Meq Pack (Potassium chloride) .... Every morning 3)  Lasix 40 Mg Tabs (Furosemide) .Marland KitchenMarland KitchenMarland Kitchen  Take 1 tablet by mouth once a day 4)  Zestril 10 Mg Tabs (Lisinopril) .... Every morning 5)  Niaspan 1000 Mg Cr-tabs (Niacin (antihyperlipidemic)) .Marland Kitchen.. 1 tab by mouth once daily 6)  Sectral 400 Mg Caps (Acebutolol hcl) .Marland Kitchen.. 1 tab by mouth once daily 7)  Ferrous Sulfate 325 (65 Fe) Mg Tabs (Ferrous sulfate) .Marland Kitchen.. 1 tab by mouth once daily 8)  Vitamin C 250 Mg Tabs (Ascorbic acid) .Marland Kitchen.. 1 tab by mouth once daily 9)  Colace 100 Mg Caps (Docusate sodium)  .... As needed 10)  Oyst-cal-d 500 500-200 Mg-unit Tabs (Calcium carbonate-vitamin d) .Marland Kitchen.. 1 tab by mouth three times a day 11)  Metoprolol Succinate 50 Mg Xr24h-tab (Metoprolol succinate) .... Take one tablet by mouth daily 12)  Tamsulosin Hcl 0.4 Mg Caps (Tamsulosin hcl) .Marland Kitchen.. 1 tab by mouth once daily 13)  Miralax Powd (Polyethylene glycol 3350) .... As needed 14)  Norco 5-325 Mg Tabs (Hydrocodone-acetaminophen) .... As needed 15)  Aspirin 81 Mg Tbec (Aspirin) .... Take one tablet by mouth daily  Other Orders: Flu Vaccine 47yrs + MEDICARE PATIENTS (G6440) Administration Flu vaccine - MCR (H4742) Ophthalmology Referral (Ophthalmology)  Patient Instructions: 1)  I agree with the surgeons that is probably related to an orthopedic issue.  If however, he would like a second opinion.  I would recommend a general surgeon, Dr. Jamey Ripa 2)  I will put in a request for you to see Dr. [facility]ilton [facility]   Orders Added: 1)  Flu Vaccine 52yrs + MEDICARE PATIENTS [Q2039] 2)  Administration Flu vaccine - MCR [G0008] 3)  Ophthalmology Referral [Ophthalmology] 4)  Est. Patient Level III [59563]

## 2010-02-21 NOTE — Assessment & Plan Note (Signed)
Summary: Cardiology Nuclear Study  Nuclear Med Background Indications for Stress Test: Evaluation for Ischemia, Post Hospital  Indications Comments: 03/11[facility] ED urosepsis with mildly increased troponins  History: Myocardial Perfusion Study  History Comments: '05 [facility] NL EF 66%  Symptoms: Chest Pain with Exertion, DOE, Palpitations    Nuclear Pre-Procedure Cardiac Risk Factors: Hypertension, Lipids Caffeine/Decaff Intake: None NPO After: 6:30 PM Lungs: clear IV 0.9% NS with Angio Cath: 20g     IV Site: (R) AC IV Started by: Stanton Kidney EMT-P Chest Size (in) 42     Height (in): 70 Weight (lb): 164 BMI: 23.62 Tech Comments: Metoprolol held this am, per Patient.  Nuclear Med Study 1 or 2 day study:  1 day     Stress Test Type:  Eugenie Birks Reading MD:  Arvilla Meres, MD     Referring MD:  T.Gollan Resting Radionuclide:  Technetium 56m Tetrofosmin     Resting Radionuclide Dose:  10.0 mCi  Stress Radionuclide:  Technetium 28m Tetrofosmin     Stress Radionuclide Dose:  33.0 mCi   Stress Protocol   Lexiscan: 0.4 mg   Stress Test Technologist:  Milana Na EMT-P     Nuclear Technologist:  Domenic Polite CNMT  Rest Procedure  Myocardial perfusion imaging was performed at rest 45 minutes following the intravenous administration of Myoview Technetium 29m Tetrofosmin.  Stress Procedure  The patient received IV Lexiscan 0.4 mg over 15-seconds.  Myoview injected at 30-seconds.  There were no significant changes and occ pvcs with infusion.  Quantitative spect images were obtained after a 45 minute delay.  QPS Raw Data Images:  Normal; no motion artifact; normal heart/lung ratio. Stress Images:  Mildly decreased perfusion in the infero-apical wall Rest Images:  Normal homogeneous uptake in all areas of the myocardium. Subtraction (SDS):  Mild reversible defect suggestive of ischemia in the inferoapical wall. Transient Ischemic Dilatation:  1.07  (Normal <1.22)  Lung/Heart  Ratio:  .32  (Normal <0.45)  Quantitative Gated Spect Images QGS EDV:  91 ml QGS ESV:  36 ml QGS EF:  60 % QGS cine images:  Normal.  Findings Low risk nuclear study Evidence for inferior ischemia      Overall Impression  Exercise Capacity: Lexiscan study with no exercise. ECG Impression: Baseline: NSR; No significant ST segment change with Lexiscan. Overall Impression: Low risk stress nuclear study. Overall Impression Comments: Mild reversible defect suggestive of ischemia in the inferoapical wall.  Appended Document: Cardiology Nuclear Study Low risk study; schedule f/u ov 6-8 weeks  Appended Document: Cardiology Nuclear Study pt aware of results, follow up appt made

## 2010-02-21 NOTE — Assessment & Plan Note (Signed)
Summary: EPH   History of Present Illness: 77 year old male recently admitted in March 2011 with urosepsis. Cardiac markers checked for unclear reasons. Patient was having no symptoms. His troponin was mildly elevated at 0.51, 0.53 and 0.25 but we elected to treat medically.  Note he had pseudomonas in his blood. Since he was discharged he denies any dyspnea, chest pain, palpitations, syncope or increased pedal edema.  Current Medications (verified): 1)  Fish Oil   Oil (Fish Oil) .... Take 1 Tablet By Mouth Two Times A Day 2)  Potassium Chloride 20 Meq  Pack (Potassium Chloride) .... Every Morning 3)  Lasix 40 Mg  Tabs (Furosemide) .Marland Kitchen.. 1 Every Morning 1/2 At [facility] Daily 4)  Zestril 10 Mg  Tabs (Lisinopril) .... Every Morning 5)  Niaspan 1000 Mg Cr-Tabs (Niacin (Antihyperlipidemic)) .Marland Kitchen.. 1 Tab By Mouth Once Daily 6)  Acebutolol Hcl 400 Mg  Caps (Acebutolol Hcl) .... Take 1 Tablet By Mouth Every Morning 7)  Aspirin Ec 325 Mg Tbec (Aspirin) .Marland Kitchen.. 1 Tab By Mouth Two Times A Day 8)  Sectral 400 Mg Caps (Acebutolol Hcl) .Marland Kitchen.. 1 Tab By Mouth Once Daily 9)  Ferrous Sulfate 325 (65 Fe) Mg  Tabs (Ferrous Sulfate) .Marland Kitchen.. 1 Tab By Mouth Once Daily 10)  Vitamin C 250 Mg Tabs (Ascorbic Acid) .Marland Kitchen.. 1 Tab By Mouth Once Daily 11)  Colace 100 Mg Caps (Docusate Sodium) .Marland Kitchen.. 1 Tab By Mouth Two Times A Day 12)  Oyst-Cal-D 500 500-200 Mg-Unit Tabs (Calcium Carbonate-Vitamin D) .Marland Kitchen.. 1 Tab By Mouth Three Times A Day 13)  Metoprolol Succinate 50 Mg Xr24h-Tab (Metoprolol Succinate) .... Take One Tablet By Mouth Daily 14)  Tamsulosin Hcl 0.4 Mg Caps (Tamsulosin Hcl) .Marland Kitchen.. 1 Tab By Mouth Once Daily 15)  Miralax  Powd (Polyethylene Glycol 3350) .... As Needed 16)  Norco 5-325 Mg Tabs (Hydrocodone-Acetaminophen) .... As Needed 17)  Robaxin 500 Mg Tabs (Methocarbamol) .... As Needed  Allergies: 1)  ! * Ivp Dye  Past History:  Past Medical History: HYPERTENSION (ICD-401.9) DYSLIPIDEMIA (ICD-272.4) NEPHROLITHIASIS  (ICD-592.0) RENAL FAILURE (ICD-586) GOUT (ICD-274.9) BENIGN PROSTATIC HYPERTROPHY (ICD-600.00) PROSTATE CANCER, HX OF (ICD-V10.46) cystic lesion L scrotum Prior hip fracture  Past Surgical History: Reviewed history from 06/02/2009 and no changes required.  He is status post vertebroplasty for his  compression fracture as well as right ORIF for his hip fracture  prostatectomy  tonsillectomy  appendectomy  colonoscopy x2 as well as cystoscopy with dilatation and laser surgery on his bladder neck for   the contracture.      Social History: Reviewed history from 01/27/2007 and no changes required. Retired Product/process development scientist Never Smoked Alcohol use-no Drug use-no Regular exercise-yes  Review of Systems       Some residual pain in right hip but no fevers or chills, productive cough, hemoptysis, dysphasia, odynophagia, melena, hematochezia, dysuria, hematuria, rash, seizure activity, orthopnea, PND, pedal edema, claudication. Remaining systems are negative.   Vital Signs:  Patient profile:   77 year old male Height:      70 inches Weight:      163 pounds BMI:     23.47 Pulse rate:   72 / minute Resp:     14 per minute BP sitting:   106 / 58  (left arm)  Vitals Entered By: Kem Parkinson (Jun 03, 2009 10:41 AM)  Physical Exam  General:  Well-developed well-nourished in no acute distress.  Skin is warm and dry.  HEENT is normal.  Neck is supple. No thyromegaly.  Chest is clear to auscultation with normal expansion.  Cardiovascular exam is regular rate and rhythm.  Abdominal exam nontender or distended. No masses palpated. Extremities show trace edema. neuro grossly intact    EKG  Procedure date:  06/03/2009  Findings:      Sinus at a rate of 71. Axis normal. Occasional PVCs. No ST changes.  Impression & Recommendations:  Problem # 1:  OTHER NONSPECIFIC FINDINGS EXAMINATION OF BLOOD (ICD-790.99) Patient had mildly elevated troponins in the hospital. This may have been  secondary to demand ischemia. I will schedule a Myoview for risk stratification. If normal no further workup indicated.  Problem # 2:  HYPERTENSION (ICD-401.9) Blood pressure controlled on present medications. Will continue. Note he is on 2 different beta blockers. Discontinue Toprol. His updated medication list for this problem includes:    Lasix 40 Mg Tabs (Furosemide) .Marland Kitchen... 1 every morning 1/2 at noon daily    Zestril 10 Mg Tabs (Lisinopril) ..... Every morning    Acebutolol Hcl 400 Mg Caps (Acebutolol hcl) .Marland Kitchen... Take 1 tablet by mouth every morning    Aspirin Ec 325 Mg Tbec (Aspirin) .Marland Kitchen... 1 tab by mouth two times a day    Sectral 400 Mg Caps (Acebutolol hcl) .Marland Kitchen... 1 tab by mouth once daily    Metoprolol Succinate 50 Mg Xr24h-tab (Metoprolol succinate) .Marland Kitchen... Take one tablet by mouth daily  Orders: Nuclear Stress Test (Nuc Stress Test)  Problem # 3:  DYSLIPIDEMIA (ICD-272.4) Continue present medications. Followup primary care. His updated medication list for this problem includes:    Niaspan 1000 Mg Cr-tabs (Niacin (antihyperlipidemic)) .Marland Kitchen... 1 tab by mouth once daily  Problem # 4:  RENAL FAILURE (ICD-586) Renal insufficiency monitored by primary care.  Problem # 5:  BENIGN PROSTATIC HYPERTROPHY (ICD-600.00)  Patient Instructions: 1)  Your physician recommends that you schedule a follow-up appointment in: AS NEEDED PENDING TEST RESULTS 2)  Your physician has requested that you have an LEXISCAN stress myoview.  For further information please visit [email or web address]  Please follow instruction sheet, as given.

## 2010-02-23 NOTE — Miscellaneous (Signed)
  Clinical Lists Changes  Observations: Added new observation of BMI: 23.76  (02/16/2010 12:41) Added new observation of WEIGHT: 165 lb (02/16/2010 12:41) Added new observation of HEIGHT: 70 in (02/16/2010 12:41)        ANTICOAGULATION RECORD  NEW REGIMEN & LAB RESULTS Regimen:   (no change)      Vital Signs:  Patient profile:   77 year old male Height:      70 inches Weight:      165 pounds BMI:     23.76

## 2010-02-23 NOTE — Assessment & Plan Note (Signed)
Summary: CPX /PT WILL COME IN FASTING   Vital Signs:  Patient profile:   77 year old male Height:      70 inches Weight:      165 pounds Temp:     98.3 degrees F oral BP sitting:   120 / 80  (left arm) Cuff size:   regular  Vitals Entered By: Kern Reap CMA Duncan Dull) (February 10, 2010 8:32 AM) CC: wellness exam Is Patient Diabetic? No   CC:  wellness exam.  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  His hypertension is treated with Zestril, 10 mg daily, one potassium supplement daily, Lasix, 40 mg daily, and Sectral 400  mg daily.  BP 120/80, ......Marland Kitchenpulse 60 and regular....Marland KitchenMarland Kitchen he gets  lightheaded when he gets up, however, will decrease his Sectral 200 mg daily.and decrease the Lasix to 20 a day.  He takes niacin vitamin C, aspirin daily, along with tamsulosin .4 mg daily for BPH.  Last year had a fall which resulted in a compression fracture of his spine.  He underwent a vertebroplasty injection, pain when one he then fell and broke his right hip.  Required surgical correction.  Complications.  Postop the BPH, required readmission.  Cardiac eval, negative, including follow-up stress test by Dr. Jens Som.  Tetanus 2004, seasonal flu 2011, Pneumovax 2005.Marland Kitchen  Here for Medicare AWV:  1.   Risk factors based on Past M, S, F history:..reviewed no changes except for above 2.   Physical Activities: walking daily.  Feels good 3.   Depression/mood: good mood.  No depression 4.   Hearing: bilateral hearing aids 5.   ADL's: functions independently 6.   Fall Risk: reviewed and identified 7.   Home Safety: no guns in the house 8.   Height, weight, &visual acuity:height weight, normal.  Vision normal 9.   Counseling: continue good health habits 10.   Labs ordered based on risk factors: ordered today 11.           Referral  Coordination.......none indicated 12.           Care Plan......Marland Kitchenreviewed all medications 13.            Cognitive Assessment.......son has financial and health care power-of-attorney living will instituted   Allergies: 1)  ! * Ivp Dye  Past History:  Past medical, surgical, family and social histories (including risk factors) reviewed, and no changes noted (except as noted below).  Past Medical History: Reviewed history from 06/03/2009 and no changes required. HYPERTENSION (ICD-401.9) DYSLIPIDEMIA (ICD-272.4) NEPHROLITHIASIS (ICD-592.0) RENAL FAILURE (ICD-586) GOUT (ICD-274.9) BENIGN PROSTATIC HYPERTROPHY (ICD-600.00) PROSTATE CANCER, HX OF (ICD-V10.46) cystic lesion L scrotum Prior hip fracture  Past Surgical History: Reviewed history from 06/02/2009 and no changes required.  He is status post vertebroplasty for his  compression fracture as well as right ORIF for his hip fracture  prostatectomy  tonsillectomy  appendectomy  colonoscopy x2 as well as cystoscopy with dilatation and laser surgery on his bladder neck for   the contracture.      Family History: Reviewed history from 01/27/2007 and no changes required. father died of a stroke and underlying Parkinson's disease .  Mom died of pneumonia and high blood pressure  Social History: Reviewed history from 01/27/2007 and no changes required. Retired Product/process development scientist Never Smoked Alcohol use-no Drug use-no Regular exercise-yes  Review of Systems  See HPI  Physical Exam  General:  Well-developed,well-nourished,in no acute distress; alert,appropriate and cooperative throughout examination Head:  Normocephalic and atraumatic without obvious abnormalities. No apparent alopecia or balding. Eyes:  No corneal or conjunctival inflammation noted. EOMI. Perrla. Funduscopic exam benign, without hemorrhages, exudates or papilledema. Vision grossly normal. Ears:  External ear exam shows no significant lesions or deformities.   Otoscopic examination reveals clear canals, tympanic membranes are intact bilaterally without bulging, retraction, inflammation or discharge. Hearing is grossly normal bilaterally. Nose:  External nasal examination shows no deformity or inflammation. Nasal mucosa are pink and moist without lesions or exudates. Mouth:  Oral mucosa and oropharynx without lesions or exudates.  Teeth in good repair. Neck:  No deformities, masses, or tenderness noted. Chest Wall:  No deformities, masses, tenderness or gynecomastia noted. Breasts:  No masses or gynecomastia noted Lungs:  Normal respiratory effort, chest expands symmetrically. Lungs are clear to auscultation, no crackles or wheezes. Heart:  Normal rate and regular rhythm. S1 and S2 normal without gallop, murmur, click, rub or other extra sounds. Abdomen:  Bowel sounds positive,abdomen soft and non-tender without masses, organomegaly or hernias noted. Rectal:  No external abnormalities noted. Normal sphincter tone. No rectal masses or tenderness. Genitalia:  uro3 weeks ago Prostate:  uro 3 weeks ago because of BPH Msk:  No deformity or scoliosis noted of thoracic or lumbar spine.   Pulses:  R and L carotid,radial,femoral,dorsalis pedis and posterior tibial pulses are full and equal bilaterally Extremities:  No clubbing, cyanosis, edema, or deformity noted with normal full range of motion of all joints.   Neurologic:  No cranial nerve deficits noted. Station and gait are normal. Plantar reflexes are down-going bilaterally. DTRs are symmetrical throughout. Sensory, motor and coordinative functions appear intact. Skin:  total body skin exam normal.  He has a large irritated seborrheic keratosis on his left flank.  He would like to move, because it's irritated itchy and red Cervical Nodes:  No lymphadenopathy noted Axillary Nodes:  No palpable lymphadenopathy Inguinal Nodes:  No significant adenopathy Psych:  Cognition and judgment appear intact. Alert and  cooperative with normal attention span and concentration. No apparent delusions, illusions, hallucinations   Impression & Recommendations:  Problem # 1:  HYPERTENSION (ICD-401.9) Assessment Improved  The following medications were removed from the medication list:    Lasix 40 Mg Tabs (Furosemide) .Marland Kitchen... Take 1 tablet by mouth once a day    Sectral 400 Mg Caps (Acebutolol hcl) .Marland Kitchen... 1 tab by mouth once daily    Metoprolol Succinate 50 Mg Xr24h-tab (Metoprolol succinate) .Marland Kitchen... Take one tablet by mouth daily His updated medication list for this problem includes:    Zestril 10 Mg Tabs (Lisinopril) ..... Every morning    Sectral 200 Mg Caps (Acebutolol hcl) .Marland Kitchen... Take 1 tablet by mouth every morning    Lasix 20 Mg Tabs (Furosemide) .Marland Kitchen... Take 1 tablet by mouth every morning  Orders: Prescription Created Electronically [phone]) Medicare -1st Annual Wellness Visit [phone]) Urinalysis-dipstick only (Medicare patient) [phone]) Venipuncture (84696) TLB-BMP (Basic Metabolic Panel-BMET) (80048-METABOL) TLB-CBC Platelet - w/Differential (85025-CBCD) TLB-Hepatic/Liver Function Pnl (80076-HEPATIC) TLB-TSH (Thyroid Stimulating Hormone) (84443-TSH)  Problem # 2:  Preventive Health Care (ICD-V70.0) Assessment: Unchanged  Complete Medication List: 1)  Fish Oil Oil (Fish oil) .... Take 1 tablet by mouth two times a day 2)  Potassium Chloride 20 Meq Pack (Potassium chloride) .... Every morning 3)  Zestril 10 Mg Tabs (Lisinopril) .... Every morning 4)  Niaspan 1000 Mg Cr-tabs (Niacin (antihyperlipidemic)) .Marland KitchenMarland KitchenMarland Kitchen  1 tab by mouth once daily 5)  Ferrous Sulfate 325 (65 Fe) Mg Tabs (Ferrous sulfate) .Marland Kitchen.. 1 tab by mouth once daily 6)  Vitamin C 250 Mg Tabs (Ascorbic acid) .Marland Kitchen.. 1 tab by mouth once daily 7)  Colace 100 Mg Caps (Docusate sodium) .... As needed 8)  Oyst-cal-d 500 500-200 Mg-unit Tabs (Calcium carbonate-vitamin d) .Marland Kitchen.. 1 tab by mouth three times a day 9)  Tamsulosin Hcl 0.4 Mg Caps (Tamsulosin  hcl) .Marland Kitchen.. 1 tab by mouth once daily 10)  Aspirin 81 Mg Tbec (Aspirin) .... Take one tablet by mouth daily 11)  Icaps Areds Formula Tabs (Multiple vitamins-minerals) .... Use as directed 12)  Sectral 200 Mg Caps (Acebutolol hcl) .... Take 1 tablet by mouth every morning 13)  Lasix 20 Mg Tabs (Furosemide) .... Take 1 tablet by mouth every morning  Other Orders: Specimen Handling (98119)  Patient Instructions: 1)  decrease the Sectral to 200 mg daily, and decrease the Lasix to 20 mg daily. 2)  Check your blood pressure daily in the morning. 3)  Return in two weeks for follow-up with the date and the device Prescriptions: TAMSULOSIN HCL 0.4 MG CAPS (TAMSULOSIN HCL) 1 tab by mouth once daily  #100 x 3   Entered and Authorized by:   Roderick Pee MD   Signed by:   Roderick Pee MD on 02/10/2010   Method used:   Electronically to        [facility]  [place] [address] (retail)       [address]       [place], [place]  [address]       Ph: [phone] or [phone]       Fax: [phone]   RxID:   [id number] NIASPAN 1000 MG CR-TABS (NIACIN (ANTIHYPERLIPIDEMIC)) 1 tab by mouth once daily  #100 x 3   Entered and Authorized by:   Roderick Pee MD   Signed by:   Roderick Pee MD on 02/10/2010   Method used:   [facility] to        [facility]  [place] [address] (retail)       [address]       [place], [place]  [address]       Ph: [phone] or [phone]       Fax: [phone]   RxID:   [id number] ZESTRIL 10 MG  TABS (LISINOPRIL) every morning  #100 x 3   Entered and Authorized by:   Roderick Pee MD   Signed by:   Roderick Pee MD on 02/10/2010   Method used:   [facility]        [facility]  [place] [address] (retail[facility]       [address]       [place], [place]  [address]       Ph: [phone] or [phone]       Fax: [phone]   RxID:   [id number] POTASSIUM CHLORIDE 20 MEQ  PACK (POTASSIUM CHLORIDE) every morning  #100 Tablet x 3   Entered and Authorized by:   Roderick Pee MD   Signed by:   Roderick Pee MD on 02/10/2010   Method used:   [facility]        [facility]  [place] [address] ([facility]ail[facility]       [address]       [place], [place]  [address]  Ph: [phone] or [phone]       Fax: [phone]   RxID:   [id number] LASIX 20 MG TABS (FUROSEMIDE) Take 1 tablet by mouth every morning  #100 x 3   Entered and Authorized by:   Roderick Pee MD   Signed by:   Roderick Pee MD on 02/10/2010   Method used:   [facility]        [facility]  [place] [address] (retail)       [address]       [place], [place]  [address]       Ph: [phone] or [phone]       Fax: [phone]   RxID:   [id number] SECTRAL 200 MG CAPS (ACEBUTOLOL HCL) Take 1 tablet by mouth every morning  #100 x 3   Entered and Authorized by:   Roderick Pee MD   Signed by:   Roderick Pee MD on 02/10/2010   Method used:   [facility]        [facility]  [place] [address] (retail)       [address]       [place], [place]  [address]       Ph: [phone] or [phone]       Fax: [phone]   RxID:   [id number]    Orders Added: 1)  Prescription Created Electronically [id number] 2)  Medicare -1st Annual Wellness Visit [G0438] 3)  Urinalysis-dipstick only (Medicare patient) [81003QW] 4)  Venipuncture [41287] 5)  Specimen Handling [99000] 6)  TLB-BMP (Basic Metabolic Panel-BMET) [80048-METABOL] 7)  TLB-CBC Platelet - w/Differential [85025-CBCD] 8)  TLB-Hepatic/Liver Function Pnl [80076-HEPATIC] 9)  TLB-TSH (Thyroid Stimulating Hormone) [84443-TSH]     Laboratory Results   Urine Tests  Date/Time Recieved: February 10, 2010 12:29 PM  Date/Time Reported: February 10, 2010 12:29 PM   Routine Urinalysis   Color: yellow Appearance: Clear Glucose: negative   (Normal Range: Negative) Bilirubin: 1+   (Normal Range: Negative) Ketone: trace (5)   (Normal Range: Negative) Spec. Gravity: 1.020   (Normal Range: 1.003-1.035) Blood: negative   (Normal Range:  Negative) pH: 5.5   (Normal Range: 5.0-8.0) Protein: 1+   (Normal Range: Negative) Urobilinogen: 0.2   (Normal Range: 0-1) Nitrite: negative   (Normal Range: Negative) Leukocyte Esterace: negative   (Normal Range: Negative)    Comments: Wynona Canes, CMA  February 10, 2010 12:30 PM

## 2010-02-24 ENCOUNTER — Ambulatory Visit (INDEPENDENT_AMBULATORY_CARE_PROVIDER_SITE_OTHER): Payer: Medicare Other | Admitting: Family Medicine

## 2010-02-24 ENCOUNTER — Ambulatory Visit: Admit: 2010-02-24 | Payer: Self-pay | Admitting: Family Medicine

## 2010-02-24 ENCOUNTER — Encounter: Payer: Self-pay | Admitting: Family Medicine

## 2010-02-24 DIAGNOSIS — I10 Essential (primary) hypertension: Secondary | ICD-10-CM

## 2010-02-24 DIAGNOSIS — H6120 Impacted cerumen, unspecified ear: Secondary | ICD-10-CM

## 2010-02-24 DIAGNOSIS — H6121 Impacted cerumen, right ear: Secondary | ICD-10-CM

## 2010-02-24 DIAGNOSIS — L820 Inflamed seborrheic keratosis: Secondary | ICD-10-CM

## 2010-02-24 NOTE — Patient Instructions (Signed)
Stop the beta blocker, but continue your other medications.  Check your blood pressure daily in the morning and return in one week for follow-up

## 2010-02-24 NOTE — Progress Notes (Signed)
  Subjective:    Patient ID: Mark Velasquez, male    DOB: [birth year], 77 y.o.   MRN: [id number]  HPI Mark Velasquez is a 77 year old male, who comes in today for evaluation of 3 problems.  He has a 15 x 15 mm lesion left flank is red and irritated.  He would like removed.  He has marked hearing loss in his right ear with cerumen impaction.  He also has difficulty with hypotension.  He is currently on Sectral 200 mg daily and his blood pressure consistently is in the 120/70 range.  He is lightheaded when he stands up.  A week ago he cut his dose from 200 b.i.d. To 200 daily however, his blood pressure is still low.  He also takes Flomax .4 daily and Lasix 20 mg daily, lisinopril, 10 mg daily.   Review of Systems Negative    Objective:   Physical Exam     He is a well-developed, male no acute distress.  BP 120/70, pulse 60 and regular.  He was taken to the treatment room.  And, after informed consent, the lesion was anesthetized with 1% Xylocaine with epinephrine.  Shave excision was done.  The defect was cauterized.  Dry, sterile bandage was applied to and the lesion was sent for pathologic analysis.  The The cerumen impaction in his  right ear was removed by suction and irrigation   Assessment & Plan:

## 2010-03-03 ENCOUNTER — Encounter: Payer: Self-pay | Admitting: Family Medicine

## 2010-03-03 ENCOUNTER — Ambulatory Visit (INDEPENDENT_AMBULATORY_CARE_PROVIDER_SITE_OTHER): Payer: Medicare Other | Admitting: Family Medicine

## 2010-03-03 VITALS — BP 140/68 | Temp 97.7°F | Wt 164.0 lb

## 2010-03-03 DIAGNOSIS — I10 Essential (primary) hypertension: Secondary | ICD-10-CM

## 2010-03-03 NOTE — Progress Notes (Signed)
  Subjective:    Patient ID: Mark Velasquez, male    DOB: Jan 23, 1916, 77 y.o.   MRN: [id number]  HPI Mark Velasquez is a 77 year old male, who comes in today for reevaluation of hypertension.  We saw him a week ago, and his blood pressure was low.  We therefore stopped the beta blocker, but continued the Lasix, 20 mg daily, lisinopril, 10 mg daily, and the 120 mEq potassium supplement for now.  His BP has risen is 140/68.   Review of Systems    Other than above, negative Objective:   Physical Exam Well-developed well-nourished, male in no acute distress.  BP right arm sitting position 140/68, pulse 70 and regular       Assessment & Plan:

## 2010-03-03 NOTE — Assessment & Plan Note (Signed)
Continue your current medications.  Check your blood pressure 3 times weekly.  Return p.r.n.

## 2010-03-03 NOTE — Patient Instructions (Signed)
Continue current medications check your blood pressure 3 times weekly.  Return p.r.n.

## 2010-04-12 ENCOUNTER — Encounter: Payer: Self-pay | Admitting: Family Medicine

## 2010-04-12 ENCOUNTER — Ambulatory Visit (INDEPENDENT_AMBULATORY_CARE_PROVIDER_SITE_OTHER): Payer: Medicare Other | Admitting: Family Medicine

## 2010-04-12 DIAGNOSIS — H8110 Benign paroxysmal vertigo, unspecified ear: Secondary | ICD-10-CM

## 2010-04-12 NOTE — Progress Notes (Signed)
  Subjective:    Patient ID: Mark Velasquez, male    DOB: [birth year], 77 y.o.   MRN: [id number]  HPI Mark Velasquez is a delightful, 77 year old male, single.........  He tells me he has a girlfriend....... Who comes in today for evaluation of vertigo.  For the past couple weeks.  He said, vertigo.  It usually occurs in the morning, when he gets out of bed.  At two to 3 minutes.  It goes away.  Neurologic and ENT review of systems otherwise negative.  No hearing loss, etc.   Review of Systems In general, and ENT review of systems negative    Objective:   Physical Exam    Well-developed well-nourished, male in no acute distress.  HEENT negative.  Neck was supple.  No adenopathy.  Neurologic exam normal    Assessment & Plan:  Benign positional vertigo.  Plan reassured

## 2010-04-12 NOTE — Patient Instructions (Signed)
Remembered to get up very slowly in the morning.  Since that is y  worst time a day with vertigo.  The folks at [facility] units, and throat would be a resource if you wish to have the Epley's  maneuver done

## 2010-04-14 LAB — URINALYSIS, ROUTINE W REFLEX MICROSCOPIC
Bilirubin Urine: NEGATIVE
Bilirubin Urine: NEGATIVE
Glucose, UA: NEGATIVE mg/dL
Ketones, ur: NEGATIVE mg/dL
Ketones, ur: NEGATIVE mg/dL
Nitrite: NEGATIVE
Nitrite: POSITIVE — AB
Protein, ur: 30 mg/dL — AB
Protein, ur: NEGATIVE mg/dL
Specific Gravity, Urine: 1.013 (ref 1.005–1.030)
Specific Gravity, Urine: 1.016 (ref 1.005–1.030)
Urobilinogen, UA: 0.2 mg/dL (ref 0.0–1.0)
Urobilinogen, UA: 1 mg/dL (ref 0.0–1.0)
pH: 6 (ref 5.0–8.0)

## 2010-04-14 LAB — URINE MICROSCOPIC-ADD ON

## 2010-04-14 LAB — DIFFERENTIAL
Basophils Absolute: 0 K/uL (ref 0.0–0.1)
Basophils Relative: 0 % (ref 0–1)
Eosinophils Absolute: 0.2 K/uL (ref 0.0–0.7)
Eosinophils Relative: 2 % (ref 0–5)
Lymphocytes Relative: 27 % (ref 12–46)
Lymphocytes Relative: 6 % — ABNORMAL LOW (ref 12–46)
Lymphs Abs: 0.6 10*3/uL — ABNORMAL LOW (ref 0.7–4.0)
Lymphs Abs: 2 10*3/uL (ref 0.7–4.0)
Monocytes Absolute: 0.1 K/uL (ref 0.1–1.0)
Monocytes Relative: 1 % — ABNORMAL LOW (ref 3–12)
Neutro Abs: 4.3 10*3/uL (ref 1.7–7.7)
Neutro Abs: 8.5 K/uL — ABNORMAL HIGH (ref 1.7–7.7)
Neutrophils Relative %: 58 % (ref 43–77)
Neutrophils Relative %: 91 % — ABNORMAL HIGH (ref 43–77)

## 2010-04-14 LAB — MAGNESIUM: Magnesium: 1.9 mg/dL (ref 1.5–2.5)

## 2010-04-14 LAB — TROPONIN I: Troponin I: 0.53 ng/mL (ref 0.00–0.06)

## 2010-04-14 LAB — COMPREHENSIVE METABOLIC PANEL
ALT: 21 U/L (ref 0–53)
Albumin: 2.4 g/dL — ABNORMAL LOW (ref 3.5–5.2)
Alkaline Phosphatase: 86 U/L (ref 39–117)
BUN: 33 mg/dL — ABNORMAL HIGH (ref 6–23)
Potassium: 4.4 mEq/L (ref 3.5–5.1)
Sodium: 137 mEq/L (ref 135–145)
Total Protein: 5.6 g/dL — ABNORMAL LOW (ref 6.0–8.3)

## 2010-04-14 LAB — PROTIME-INR
INR: 0.99 (ref 0.00–1.49)
Prothrombin Time: 13 seconds (ref 11.6–15.2)

## 2010-04-14 LAB — CBC
HCT: 31.7 % — ABNORMAL LOW (ref 39.0–52.0)
HCT: 34.8 % — ABNORMAL LOW (ref 39.0–52.0)
Hemoglobin: 10.4 g/dL — ABNORMAL LOW (ref 13.0–17.0)
Hemoglobin: 11.4 g/dL — ABNORMAL LOW (ref 13.0–17.0)
Hemoglobin: 11.5 g/dL — ABNORMAL LOW (ref 13.0–17.0)
Hemoglobin: 9.9 g/dL — ABNORMAL LOW (ref 13.0–17.0)
MCHC: 32.6 g/dL (ref 30.0–36.0)
MCHC: 32.7 g/dL (ref 30.0–36.0)
MCHC: 32.9 g/dL (ref 30.0–36.0)
MCV: 94.8 fL (ref 78.0–100.0)
MCV: 95.9 fL (ref 78.0–100.0)
Platelets: 148 10*3/uL — ABNORMAL LOW (ref 150–400)
Platelets: 227 10*3/uL (ref 150–400)
Platelets: 245 10*3/uL (ref 150–400)
RBC: 3.31 MIL/uL — ABNORMAL LOW (ref 4.22–5.81)
RBC: 3.67 MIL/uL — ABNORMAL LOW (ref 4.22–5.81)
RBC: 3.67 MIL/uL — ABNORMAL LOW (ref 4.22–5.81)
RDW: 14.7 % (ref 11.5–15.5)
RDW: 14.9 % (ref 11.5–15.5)
RDW: 14.9 % (ref 11.5–15.5)
WBC: 7.4 10*3/uL (ref 4.0–10.5)
WBC: 9.4 10*3/uL (ref 4.0–10.5)
WBC: 9.7 10*3/uL (ref 4.0–10.5)

## 2010-04-14 LAB — ABO/RH: ABO/RH(D): AB NEG

## 2010-04-14 LAB — APTT: aPTT: 32 seconds (ref 24–37)

## 2010-04-14 LAB — BASIC METABOLIC PANEL
BUN: 18 mg/dL (ref 6–23)
BUN: 28 mg/dL — ABNORMAL HIGH (ref 6–23)
CO2: 26 mEq/L (ref 19–32)
CO2: 26 mEq/L (ref 19–32)
CO2: 26 mEq/L (ref 19–32)
Calcium: 7.9 mg/dL — ABNORMAL LOW (ref 8.4–10.5)
Calcium: 8.1 mg/dL — ABNORMAL LOW (ref 8.4–10.5)
Chloride: 104 mEq/L (ref 96–112)
Chloride: 106 mEq/L (ref 96–112)
Creatinine, Ser: 1.32 mg/dL (ref 0.4–1.5)
Creatinine, Ser: 1.36 mg/dL (ref 0.4–1.5)
Creatinine, Ser: 1.39 mg/dL (ref 0.4–1.5)
GFR calc Af Amer: 47 mL/min — ABNORMAL LOW (ref >60)
GFR calc Af Amer: >60 mL/min (ref >60)
GFR calc non Af Amer: 48 mL/min — ABNORMAL LOW (ref >60)
Glucose, Bld: 101 mg/dL — ABNORMAL HIGH (ref 70–99)
Glucose, Bld: 117 mg/dL — ABNORMAL HIGH (ref 70–99)
Potassium: 3.8 mEq/L (ref 3.5–5.1)
Sodium: 135 mEq/L (ref 135–145)
Sodium: 138 mEq/L (ref 135–145)

## 2010-04-14 LAB — URINE CULTURE: Colony Count: >=100000

## 2010-04-14 LAB — CULTURE, BLOOD (ROUTINE X 2)

## 2010-04-14 LAB — POCT I-STAT, CHEM 8
Calcium, Ion: 1.11 mmol/L — ABNORMAL LOW (ref 1.12–1.32)
Chloride: 108 mEq/L (ref 96–112)
HCT: 33 % — ABNORMAL LOW (ref 39.0–52.0)
TCO2: 23 mmol/L (ref 0–100)

## 2010-04-14 LAB — CK TOTAL AND CKMB (NOT AT ARMC)
CK, MB: 1.4 ng/mL (ref 0.3–4.0)
Total CK: 37 U/L (ref 7–232)

## 2010-04-14 LAB — CROSSMATCH: Antibody Screen: NEGATIVE

## 2010-04-14 LAB — LACTIC ACID, PLASMA: Lactic Acid, Venous: 2.4 mmol/L — ABNORMAL HIGH (ref 0.5–2.2)

## 2010-04-17 LAB — BASIC METABOLIC PANEL
BUN: 15 mg/dL (ref 6–23)
CO2: 23 mEq/L (ref 19–32)
Calcium: 8.1 mg/dL — ABNORMAL LOW (ref 8.4–10.5)
Chloride: 109 mEq/L (ref 96–112)
Creatinine, Ser: 1.23 mg/dL (ref 0.4–1.5)
Creatinine, Ser: 1.3 mg/dL (ref 0.4–1.5)
Creatinine, Ser: 1.39 mg/dL (ref 0.4–1.5)
GFR calc Af Amer: 58 mL/min — ABNORMAL LOW (ref >60)
GFR calc Af Amer: >60 mL/min (ref >60)
GFR calc non Af Amer: 52 mL/min — ABNORMAL LOW (ref >60)
GFR calc non Af Amer: 55 mL/min — ABNORMAL LOW (ref >60)
Glucose, Bld: 113 mg/dL — ABNORMAL HIGH (ref 70–99)

## 2010-04-17 LAB — CBC
HCT: 27.8 % — ABNORMAL LOW (ref 39.0–52.0)
HCT: 29.1 % — ABNORMAL LOW (ref 39.0–52.0)
HCT: 29.1 % — ABNORMAL LOW (ref 39.0–52.0)
HCT: 29.2 % — ABNORMAL LOW (ref 39.0–52.0)
Hemoglobin: 9.1 g/dL — ABNORMAL LOW (ref 13.0–17.0)
Hemoglobin: 9.3 g/dL — ABNORMAL LOW (ref 13.0–17.0)
Hemoglobin: 9.8 g/dL — ABNORMAL LOW (ref 13.0–17.0)
Hemoglobin: 9.9 g/dL — ABNORMAL LOW (ref 13.0–17.0)
Hemoglobin: 9.9 g/dL — ABNORMAL LOW (ref 13.0–17.0)
MCHC: 33.5 g/dL (ref 30.0–36.0)
MCHC: 33.6 g/dL (ref 30.0–36.0)
MCHC: 33.8 g/dL (ref 30.0–36.0)
MCHC: 33.9 g/dL (ref 30.0–36.0)
MCHC: 33.9 g/dL (ref 30.0–36.0)
MCV: 94.2 fL (ref 78.0–100.0)
MCV: 94.7 fL (ref 78.0–100.0)
MCV: 94.8 fL (ref 78.0–100.0)
MCV: 95.2 fL (ref 78.0–100.0)
MCV: 95.3 fL (ref 78.0–100.0)
Platelets: 256 K/uL (ref 150–400)
Platelets: 308 K/uL (ref 150–400)
Platelets: 374 10*3/uL (ref 150–400)
Platelets: 458 K/uL — ABNORMAL HIGH (ref 150–400)
RBC: 2.85 MIL/uL — ABNORMAL LOW (ref 4.22–5.81)
RBC: 2.92 MIL/uL — ABNORMAL LOW (ref 4.22–5.81)
RBC: 3.06 MIL/uL — ABNORMAL LOW (ref 4.22–5.81)
RBC: 3.08 MIL/uL — ABNORMAL LOW (ref 4.22–5.81)
RDW: 14.9 % (ref 11.5–15.5)
RDW: 15.3 % (ref 11.5–15.5)
RDW: 15.3 % (ref 11.5–15.5)
RDW: 15.9 % — ABNORMAL HIGH (ref 11.5–15.5)
WBC: 11.5 K/uL — ABNORMAL HIGH (ref 4.0–10.5)
WBC: 8.9 K/uL (ref 4.0–10.5)
WBC: 9.3 K/uL (ref 4.0–10.5)

## 2010-04-17 LAB — URINE CULTURE: Colony Count: NO GROWTH

## 2010-04-17 LAB — COMPREHENSIVE METABOLIC PANEL WITH GFR
ALT: 18 U/L (ref 0–53)
AST: 22 U/L (ref 0–37)
Albumin: 2 g/dL — ABNORMAL LOW (ref 3.5–5.2)
Alkaline Phosphatase: 68 U/L (ref 39–117)
BUN: 32 mg/dL — ABNORMAL HIGH (ref 6–23)
CO2: 24 meq/L (ref 19–32)
Calcium: 7.9 mg/dL — ABNORMAL LOW (ref 8.4–10.5)
Chloride: 104 meq/L (ref 96–112)
Creatinine, Ser: 1.63 mg/dL — ABNORMAL HIGH (ref 0.4–1.5)
GFR calc non Af Amer: 40 mL/min — ABNORMAL LOW
Glucose, Bld: 125 mg/dL — ABNORMAL HIGH (ref 70–99)
Potassium: 4.3 meq/L (ref 3.5–5.1)
Sodium: 137 meq/L (ref 135–145)
Total Bilirubin: 0.7 mg/dL (ref 0.3–1.2)
Total Protein: 4.9 g/dL — ABNORMAL LOW (ref 6.0–8.3)

## 2010-04-17 LAB — URINALYSIS, ROUTINE W REFLEX MICROSCOPIC
Glucose, UA: NEGATIVE mg/dL
Nitrite: NEGATIVE
Protein, ur: NEGATIVE mg/dL

## 2010-04-17 LAB — BASIC METABOLIC PANEL WITH GFR
BUN: 16 mg/dL (ref 6–23)
CO2: 22 meq/L (ref 19–32)
Calcium: 8.1 mg/dL — ABNORMAL LOW (ref 8.4–10.5)
Chloride: 110 meq/L (ref 96–112)
Creatinine, Ser: 1.25 mg/dL (ref 0.4–1.5)
GFR calc non Af Amer: 54 mL/min — ABNORMAL LOW
Glucose, Bld: 134 mg/dL — ABNORMAL HIGH (ref 70–99)
Potassium: 3.8 meq/L (ref 3.5–5.1)
Sodium: 138 meq/L (ref 135–145)

## 2010-04-17 LAB — FERRITIN: Ferritin: 394 ng/mL — ABNORMAL HIGH (ref 22–322)

## 2010-04-17 LAB — IRON AND TIBC
Iron: 32 ug/dL — ABNORMAL LOW (ref 42–135)
Saturation Ratios: 23 % (ref 20–55)
TIBC: 137 ug/dL — ABNORMAL LOW (ref 215–435)
UIBC: 105 ug/dL

## 2010-04-17 LAB — CK TOTAL AND CKMB (NOT AT ARMC)
CK, MB: 1.6 ng/mL (ref 0.3–4.0)
Relative Index: INVALID (ref 0.0–2.5)
Total CK: 55 U/L (ref 7–232)
Total CK: 61 U/L (ref 7–232)

## 2010-04-17 LAB — VITAMIN B12: Vitamin B-12: 260 pg/mL (ref 211–911)

## 2010-04-17 LAB — RETICULOCYTES: RBC.: 3.15 MIL/uL — ABNORMAL LOW (ref 4.22–5.81)

## 2010-04-17 LAB — URINE MICROSCOPIC-ADD ON

## 2010-04-17 LAB — TROPONIN I: Troponin I: 0.25 ng/mL — ABNORMAL HIGH (ref 0.00–0.06)

## 2010-06-09 NOTE — Discharge Summary (Signed)
NAME:  Mark Velasquez, Mark Velasquez NO.:  [id number]   MEDICAL RECORD NO.:  [id number]                   PATIENT TYPE:  INP   LOCATION:  0442                                 FACILITY:  [facility]   PHYSICIAN:  Gordy Savers, M.D. [facility]      DATE OF BIRTH:  1916/08/02   DATE OF ADMISSION:  11/12/2001  DATE OF DISCHARGE:  11/16/2001                                 DISCHARGE SUMMARY   FINAL DIAGNOSES:  Prostatitis with early urosepsis.   DISCHARGE MEDICATIONS:  1. Cipro 500 mg twice a day for thirty additional days.  2. Detrol 10 mg at bedtime.  3. HCTZ 25 mg each morning.  4. Niacin 100 mg daily.  5. Flomax 0.4 mg daily.  6. Aspirin 325 mg daily.   HISTORY OF PRESENT ILLNESS:  The patient is an 77 year old white gentleman  who was admitted from the office with fever, chills, and possible early  urosepsis. He was well until five days prior to admission when he developed  fever and chills. He was seen in the office a few days prior and urine  culture was performed and initial UA did reveal some pyuria. He was felt  clinically to have urinary tract infection and possible early pyelonephritis  and was treated with 1 gram of IV Rocephin and started on Cipro 500 mg twice  a day. For the last 48 hours, he has worsened and has been unable to  tolerate oral intake. He is subsequently admitted for IV fluids and  parenteral antibiotic therapy.   HOSPITAL COURSE:  The patient was admitted to [facility]  where he was supported with IV fluids and blood and urine cultures were  obtained. It was felt initially that he had early urosepsis. After blood  cultures were obtained, he was treated with Rocephin 1 gram IV. In the  hospital, he was seen in consult by the urological service as well as  general surgery. Due to elevated liver function studies a gallbladder  ultrasound was obtained that revealed findings consistent with early  acalculous cholecystitis.  The patient however, had an unremarkable clinical  examination and with treatment, liver function studies resolved. It was felt  that liver function studies abnormalities were related to his sepsis  syndrome and the patient did not have cholecystitis. At the time of  discharge, he was clinically well, tolerating a normal diet, afebrile, and  ambulating without difficulty. His IV Rocephin was discontinued and he was  placed on Cipro 500 mg twice a day.   LABORATORY DATA:  CBC revealed white count of 5.5. Chemistries unremarkable  except for sodium of 133. Transaminases were 59 and 54.   DISPOSITION:  The patient is discharged to complete 30 additional days of  Cipro 500 mg twice a day.    FOLLOW UP:  He has been asked to follow-up with his primary care physician  early next week.   CONDITION ON DISCHARGE:  Improved.  Gordy Savers, M.D. [facility]    PFK/MEDQ  D:  11/16/2001  T:  11/16/2001  Job:  [id number]   cc:   Tinnie Gens A. Tawanna Cooler, M.D. [facility]

## 2010-06-09 NOTE — Consult Note (Signed)
NAME:  Mark Velasquez, OSORTO NO.:  [id number]   MEDICAL RECORD NO.:  [id number]                   PATIENT TYPE:  INP   LOCATION:  0442                                 FACILITY:  [facility]   PHYSICIAN:  Sharlet Salina T. Hoxworth, M.D.          DATE OF BIRTH:  1916-08-18   DATE OF CONSULTATION:  11/14/2001  DATE OF DISCHARGE:  11/16/2001                                   CONSULTATION   HISTORY OF PRESENT ILLNESS:  I was asked by Dr. Tawanna Cooler to evaluate this  patient for possible cholecystitis.  The patient is a very pleasant 77-year-  old white male admitted to the hospital on 11/12/01.  He was feeling well  until about three or four days prior to this when he developed a gradual  onset of worsening fever and chills.  He had noted fever at home up as high  as 101.6.  He did not seek medical attention initially, but then developed  progressive weakness and had a near syncope spell at home, and was seen as  an outpatient.  At that time, evaluation was consistent with an urinary  tract infection based on the urinalysis and culture was sent.  I do not have  the culture results.  He was given Rocephin and p.o. Cipro.  He, however,  continued to have fever and weakness and was admitted on 11/12/01.  The  patient has a history of urinary tract infections and has had radioactive  seeds implanted for prostate cancer.  The patient denies at any point any  abdominal pain or GI symptoms such as nausea, vomiting, bloating, gas, or  change in bowel habits.  Since being hospitalized, the patient has felt  progressively stronger and fevers have decreased.  Mild elevation of the  liver function tests, specifically the transaminases were noted on  admission, and a gallbladder ultrasound has been obtained which I have  reviewed.   PAST MEDICAL HISTORY:  1. Seed implantation for cancer of the prostate.  He is followed by Dr.     Vonita Moss.  This was in 1999.  2. Appendectomy.  3.  Tonsillectomy.  4. History of mild hypertension.  5. History of cardiac arythmias.   MEDICATIONS:  1. Detrol 2 mg q.h.s.  2. Hydrochlorothiazide 25 mg q.a.m.  3. Niacin 100 mg q.d.  4. Flomax 0.4 mg q.d.  5. Aspirin 325 mg q.d.  6. Acebutolol 200 mg q.d.   ALLERGIES:  No known drug allergies.   SOCIAL HISTORY:  Recently widowed.  No cigarettes or alcohol.   REVIEW OF SYMPTOMS:  GENERAL:  No weight loss, fatigue.  CARDIOVASCULAR:  Denies chest pain or palpitations.  RESPIRATORY:  Denies shortness of  breath, cough.  GASTROINTESTINAL:  As above.  GENITOURINARY:  Has some  history of urinary incontinence following prostate surgery.   PHYSICAL EXAMINATION:  VITAL SIGNS:  Temperature maximum is 99.4 today, now  afebrile.  Vital signs all  within normal limits.  GENERAL:  He is a pleasant elderly white male in no acute distress.  SKIN:  Warm and dry.  HEENT:  Sclerae not icteric.  Oropharynx clear.  LUNGS:  Clear to auscultation.  Adenopathy, none palpable.  CARDIAC:  Regular rate and rhythm without murmurs.  ABDOMEN:  Soft, nontender to deep palpation, no distention.  No palpable  masses or hepatosplenomegaly.   LABORATORY DATA:  Significant for a mildly elevated SGOT and SGPT of 67 and  68, respectively on 11/12/01, now down to 59 and 54, respectively.  Bilirubin, alkaline phosphatase, well within normal limits.  A CBC shows a  white blood cell count of 5.5, hemoglobin 11.8.  Ultrasound of the  gallbladder revealed some very slight thickening of the gallbladder wall of  2.7 mm with some slight sludge in the gallbladder.  Common bile duct was  felt to be definitely enlarged at 9 mm.  No stones seen.   ASSESSMENT AND PLAN:  Febrile illness, improving on antibiotics, felt likely  urosepsis based on urinalysis.  There is some mild evidence of cholecystitis  on ultrasound with minimally thickened wall and sludge in the gallbladder.  He does have minimally elevated liver function tests  that are improving, and  a slightly enlarged common bile duct.  Certainly, his mild increase in liver  function tests may be secondary to urosepsis and are improving.  I do not  see any clinical evidence to support cholecystitis in the way of any  abdominal pain, GI symptoms, or tenderness on examination.  As his common  bile duct is felt to be enlarged and liver function tests are mildly  elevated, one might consider a MRCP to evaluate his common bile duct non-  invasively.  I do not think an endoscopic retrograde  cholangiopancreatography would be indicated, and I might defer this to a  gastroenterologist.  Might simply consider following his liver function  tests as well.  I would reserve surgical intervention unless he shows  worsening evidence of infection with no other apparent source, or has  symptoms referable to his gallbladder such as pain or tenderness.  Thank you  for the consultation.  I will follow with you.                                                 Lorne Skeens. Hoxworth, M.D.    Tory Emerald  D:  11/14/2001  T:  11/16/2001  Job:  [id number]   cc:   Tinnie Gens A. Tawanna Cooler, M.D. [facility]

## 2010-06-09 NOTE — Op Note (Signed)
NAME:  MARCIN, HOLTE NO.:  [id number]   MEDICAL RECORD NO.:  [id number]          PATIENT TYPE:  AMB   LOCATION:  NESC                         FACILITY:  [facility]   PHYSICIAN:  Maretta Bees. Vonita Moss, M.D.DATE OF BIRTH:  September 23, 1916   DATE OF PROCEDURE:  06/12/2004  DATE OF DISCHARGE:                                 OPERATIVE REPORT   PREOPERATIVE DIAGNOSIS:  Bladder neck contracture.   POSTOPERATIVE DIAGNOSES:  1.  Bladder contracture.  2.  Urethral stricture.   PROCEDURES:  Cystoscopy, urethral dilation and laser incision of bladder  neck contracture.   SURGEON:  Dr. Larey Dresser.   ANESTHESIA:  General.   INDICATIONS:  This 77 year old gentleman who has a history of prostatic  carcinoma treated previously with radiation therapy was brought to the OR  today for evaluation of a bladder neck contracture.  He had problems with  voiding and retention requiring dilation in the office and is brought the OR  today for further definitive therapy of bladder neck contracture.   PROCEDURE:  The patient brought to the operating room, placed in lithotomy  position.  The external genitalia were prepped and draped in the usual  fashion.  He was cystoscoped, and he had a bulbous urethral stricture that  was partly dilated with the rigid scope.  The prostate was opened and he had  a mild amount of bladder neck contracture.  The bladder itself had no  stones, tumors or inflammatory lesions.  The contact laser was used to  incise the bladder neck at 6 o'clock, and it opened up quite nicely.  After  completion of the laser therapy, I dilated his urethral stricture up to 28  [place].  He was then taken to the recovery room in good condition, having  tolerated the procedure well.     _________________    LJP/MEDQ  D:  06/12/2004  T:  06/12/2004  Job:  [id number]

## 2010-06-09 NOTE — Consult Note (Signed)
   NAME:  Mark Velasquez, WACHA NO.:  [id number]   MEDICAL RECORD NO.:  [id number]                   PATIENT TYPE:  INP   LOCATION:  0442                                 FACILITY:  [facility]   PHYSICIAN:  Maretta Bees. Vonita Moss, M.D.             DATE OF BIRTH:  24-Jan-1916   DATE OF CONSULTATION:  11/13/2001  DATE OF DISCHARGE:                                   CONSULTATION   HISTORY OF PRESENT ILLNESS:  I was asked to see this 77 year old gentleman,  well known to me with a history of prostatic carcinoma going back to July  1999, when a PSA was 6.42 and a Gleason grade VI carcinoma and biopsy led to  radioactive seed implantation in August 1999.  He had a PSA in June of this  year, and it was 0.04.   Since his seed implantation he has had some frequency of urination and  voiding problems, controlled with 0.4 mg of Flomax a day and 2 mg of Detrol  at bedtime.  He was seen by one of my partners in July with a UTI secondary  to Proteus and was treated with quinolones at that time.  He is admitted now  with a fever and fatigue and lethargy and suspected as having some  urosepsis.  Urinalysis was negative.  Blood cultures are pending.  His white  count was actually low at 3800.  His serum creatinine was slightly high at  1.8.  He had no increased voiding symptoms, and on examination the abdomen  was soft and nontender, with no CVA tenderness and no tenderness or evidence  of infection in the scrotum.  He does have a spermatocele that is  asymptomatic and nontender.   IMPRESSION:  1. Prostatic carcinoma with a good prostate-specific antigen and due for     follow-up later this year.  2. Voiding dysfunction, controlled with Detrol and Flomax.  3. Suspected urosepsis/prostatitis.  At this point I would agree with a four-     to six-week course of therapy with Cipro.   I will see him in one month, but call me p.r.n. in the meantime.             Maretta Bees. Vonita Moss, M.D.    LJP/MEDQ  D:  11/13/2001  T:  11/13/2001  Job:  [id number]   cc:   Rene Paci, M.D. [facility]  [address]  [place], [place] [address]  Fax: 1   Eugenio Hoes. Tawanna Cooler, M.D. [facility]

## 2010-06-09 NOTE — H&P (Signed)
NAME:  Mark Velasquez, Mark Velasquez NO.:  [id number]   MEDICAL RECORD NO.:  [id number]                   PATIENT TYPE:  INP   LOCATION:  0102                                 FACILITY:  [facility]   PHYSICIAN:  Eugenio Hoes. Tawanna Cooler, M.D. [facility]           DATE OF BIRTH:  [birth year]   DATE OF ADMISSION:  11/12/2001  DATE OF DISCHARGE:                                HISTORY & PHYSICAL   HISTORY OF PRESENT ILLNESS:  This is one of many [facility]  admissions for this 77 year old male who comes in today for treatment of  fever, chills, and possible sepsis from urinary tract infection.   The patient states he felt well until five days prior to admission when he  developed fever and chills.  He thought he might have a viral infection;  however, he had no viral symptoms.  After three days of fever and chills and  no other symptoms, he was seen in the office on November 10, 2001.  At that  time his temperature was 101.6, weight was 180.5 pounds, blood pressure  136/68, and his examination was negative.  UA was abnormal, showed white  cells and bacteria.  Culture was done.  Results pending.   At that time patient was felt to have a urinary tract infection with early  pyelonephritis.  The culture done as noted above.  He is given 1 g Rocephin  IM.  Started on Cipro 500 b.i.d. and sent home.  He was maintained on clear  liquids the last 48 hours; however, he has not gotten better.  He has  continued to have fever, chills.  He is nauseated.  He is not sure he can  keep his medicine down anymore.   Because he has not gotten better after 48 hours of outpatient therapy and he  is nauseated and does not think he can keep his medicine down, he was  admitted for IV fluids, IV antibiotics.   PAST MEDICAL HISTORY:  He has had a tonsillectomy, appendectomy, bone spurs  removed, admitted for food poisoning.  Also was admitted once in the [employer]  for tear gas exposure.  He had Sandfly fever  in [place] in 1963, was  hospitalized for that.  He has also had a frozen shoulder resolved under  anesthesia by Dr. Cleophas Dunker and a T&A and appendectomy as a child.  Past  illnesses other than above negative.  Injuries negative.   ALLERGIES:  None known.   HABITS:  Does not smoke or drink any alcohol.   CURRENT MEDICATIONS:  1. Detrol 2 mg q.h.s.  2. HCTZ 25 q.a.m.  3. Niacin 100 q.d.  4. Flomax 0.4 q.d.  5. ASA 325 mg q.o.d.  6. ________________ 200 mg q.d.   HEALTH MAINTENANCE:  His last Pneumovax was 2000.  Last flu vaccine was  2003.  His last tetanus was 1994.  He is up on all his  health maintenance  activities.  He also had prostate cancer, was treated by Dr. Vonita Moss and  has done well and has had no problems except for the urinary incontinence as  noted above.   SOCIAL HISTORY:  He is married.  He is a retired Engineer, civil (consulting).  Was in the [employer] for  many, many years.   FAMILY HISTORY:  Father died in his 91s of stroke and Parkinson's disease.  Mother died at 65 of pneumonia.  She had underlying hypertension.  Two  brothers, one died at childbirth.  Two sisters, one is living, the other  died of rheumatic fever and complications of myocardial disease at age 72.   PHYSICAL EXAMINATION:  VITAL SIGNS:  Temperature 100.7, weight 180, pulse  100 and regular, respirations 12, blood pressure 118/76.  GENERAL:  He is a thin white male who does not appear well.  He is very warm  to touch.  HEENT:  Negative.  NECK:  Supple.  Thyroid is not enlarged.  No carotid bruits.  CHEST:  Clear to auscultation.  CARDIAC:  Negative.  ABDOMEN:  Negative.  GENITALIA:  Normal male.  EXTREMITIES:  Normal.  SKIN:  Normal.  PERIPHERAL PULSES:  Normal.   LABORATORY DATA:  Urine culture pending.  It turned out less than 100,000  colonies, therefore they did not culture it but we will call and ask them to  redo that and get a culture on that.   IMPRESSION:  1. Sepsis secondary to urinary tract  infection.  2. History of cardiac arrhythmia.  3. History of hypertension.  4. Status post prostate cancer.  5. History of urinary incontinence secondary to prostate cancer surgery.  6. Status post tonsillectomy and adenoidectomy.  7. Status post appendectomy.  8. Status post bone spur.  9. Status post frozen shoulder manipulation.   PLAN:  Admit.  Pan cultures.  IV fluids.  IV Rocephin pending ID or  organism.                                               Jeffrey A. Tawanna Cooler, M.D. [facility]    JAT/MEDQ  D:  11/12/2001  T:  11/12/2001  Job:  [id number]   cc:   Maretta Bees. Vonita Moss, M.D.  [address]  [place]  [place] [address]  Fax: [phone]

## 2010-08-22 ENCOUNTER — Encounter: Payer: Self-pay | Admitting: Cardiology

## 2010-08-30 ENCOUNTER — Encounter: Payer: Self-pay | Admitting: Cardiology

## 2010-08-30 ENCOUNTER — Ambulatory Visit (INDEPENDENT_AMBULATORY_CARE_PROVIDER_SITE_OTHER): Payer: Medicare Other | Admitting: Cardiology

## 2010-08-30 DIAGNOSIS — I10 Essential (primary) hypertension: Secondary | ICD-10-CM

## 2010-08-30 DIAGNOSIS — E785 Hyperlipidemia, unspecified: Secondary | ICD-10-CM

## 2010-08-30 DIAGNOSIS — R9430 Abnormal result of cardiovascular function study, unspecified: Secondary | ICD-10-CM

## 2010-08-30 DIAGNOSIS — I2510 Atherosclerotic heart disease of native coronary artery without angina pectoris: Secondary | ICD-10-CM

## 2010-08-30 MED ORDER — METOPROLOL SUCCINATE ER 25 MG PO TB24: 25.0000 mg | ORAL_TABLET | Freq: Every day | ORAL | Status: DC

## 2010-08-30 NOTE — Assessment & Plan Note (Signed)
Blood pressure is elevated. Add Toprol 25 mg daily. His dizziness sounds more like vertigo than orthostatic mediated. If he has increased dizziness with Toprol will discontinue.

## 2010-08-30 NOTE — Progress Notes (Signed)
HPI: Pleasant male admitted in March 2011 with urosepsis. Cardiac markers checked for unclear reasons. Patient was having no symptoms. His troponin was mildly elevated at 0.51, 0.53 and 0.25 but we elected to treat medically.  Note he had pseudomonas in his blood. We scheduled a Myoview which was also performed in May of 2011. This showed an ejection fraction of 60%. There was mild infero-apical ischemia. We have treated medically. Since I last saw him in July of 2011 the patient past had problems with dizziness. This occurs more quickly in the morning. He increases with rolling over. It is not clearly positional. His beta blocker was discontinued and his Lasix decreased but his symptoms have not improved. He denies dyspnea, chest pain or syncope. Occasional mild edema.  Current Outpatient Prescriptions  Medication Sig Dispense Refill  . aspirin 81 MG tablet Take 81 mg by mouth daily.        . Calcium Carbonate-Vitamin D 500-50 MG-UNIT CAPS Take 500 tablets by mouth daily.        Marland Kitchen docusate sodium (COLACE) 100 MG capsule Take 100 mg by mouth. As needed       . fish oil-omega-3 fatty acids 1000 MG capsule Take 1 g by mouth daily.       . furosemide (LASIX) 40 MG tablet Take 20 mg by mouth daily.       Marland Kitchen lisinopril (PRINIVIL,ZESTRIL) 10 MG tablet Take 10 mg by mouth daily.        . Lutein 20 MG CAPS Take 1 capsule by mouth daily.        . Multiple Vitamins-Minerals (ICAPS) CAPS Take by mouth as directed.        . niacin 500 MG tablet Take 500 mg by mouth daily with breakfast.        . Potassium 99 MG TABS Take 1 tablet by mouth daily.        . Tamsulosin HCl (FLOMAX) 0.4 MG CAPS Take 0.4 mg by mouth daily.           Past Medical History  Diagnosis Date  . Hypertension   . Dyslipidemia   . Chronic kidney disease     renal failure  . Gout   . BPH (benign prostatic hyperplasia)   . Prostate cancer   . Hip fracture     Past Surgical History  Procedure Date  . Orif hip fracture     right    . Vertebroplasty   . Appendectomy   . Tonsillectomy   . Prostatectomy   . Cystoscopy     bladder neck contracture    History   Social History  . Marital Status: Widowed    Spouse Name: N/A    Number of Children: N/A  . Years of Education: N/A   Occupational History  . Not on file.   Social History Main Topics  . Smoking status: Never Smoker   . Smokeless tobacco: Not on file  . Alcohol Use: No  . Drug Use: No  . Sexually Active:      regular exercise - yes   Other Topics Concern  . Not on file   Social History Narrative  . No narrative on file    ROS: no fevers or chills, productive cough, hemoptysis, dysphasia, odynophagia, melena, hematochezia, dysuria, hematuria, rash, seizure activity, orthopnea, PND, pedal edema, claudication. Remaining systems are negative.  Physical Exam: Well-developed well-nourished in no acute distress.  Skin is warm and dry.  HEENT is normal.  Neck is  supple. No thyromegaly.  Chest is clear to auscultation with normal expansion.  Cardiovascular exam is regular rate and rhythm.  Abdominal exam nontender or distended. No masses palpated. Extremities show trace edema. neuro grossly intact  ECG normal sinus rhythm at a rate of 69. No ST changes.

## 2010-08-30 NOTE — Assessment & Plan Note (Signed)
Management per primary care. 

## 2010-08-30 NOTE — Patient Instructions (Signed)
Your physician wants you to follow-up in: ONE YEAR You will receive a reminder letter in the mail two months in advance. If you don't receive a letter, please call our office to schedule the follow-up appointment.   START METOPROLOL 25 MG ONE TABLET DAILY

## 2010-08-30 NOTE — Assessment & Plan Note (Signed)
Patient with presumed coronary disease secondary to a mildly abnormal nuclear study. No chest pain. Continue medical therapy. Continue aspirin and add beta blocker.

## 2010-09-08 ENCOUNTER — Other Ambulatory Visit: Payer: Self-pay | Admitting: *Deleted

## 2010-09-08 MED ORDER — ACEBUTOLOL HCL 200 MG PO CAPS
200.0000 mg | ORAL_CAPSULE | ORAL | Status: DC
Start: 2010-09-08 — End: 2011-02-13

## 2010-09-08 MED ORDER — LISINOPRIL 10 MG PO TABS: 10.0000 mg | ORAL_TABLET | Freq: Every day | ORAL | Status: DC

## 2010-09-08 MED ORDER — METOPROLOL SUCCINATE ER 25 MG PO TB24: 25.0000 mg | ORAL_TABLET | Freq: Every day | ORAL | Status: DC

## 2010-09-08 MED ORDER — FUROSEMIDE 40 MG PO TABS: 20.0000 mg | ORAL_TABLET | Freq: Every day | ORAL | Status: DC

## 2010-10-13 ENCOUNTER — Telehealth: Payer: Self-pay | Admitting: *Deleted

## 2010-10-13 NOTE — Telephone Encounter (Signed)
Patient needs an order for a hearing aid??? - patient states because of his insurance he needs you to call [facility] and speak to them about  His hearing aid.[phone].Marland KitchenMarland Kitchen

## 2010-10-26 ENCOUNTER — Ambulatory Visit (INDEPENDENT_AMBULATORY_CARE_PROVIDER_SITE_OTHER): Payer: Medicare Other | Admitting: Family Medicine

## 2010-10-26 DIAGNOSIS — Z23 Encounter for immunization: Secondary | ICD-10-CM

## 2011-01-24 DIAGNOSIS — N3941 Urge incontinence: Secondary | ICD-10-CM | POA: Diagnosis not present

## 2011-02-13 ENCOUNTER — Ambulatory Visit (INDEPENDENT_AMBULATORY_CARE_PROVIDER_SITE_OTHER): Payer: Medicare Other | Admitting: Family Medicine

## 2011-02-13 ENCOUNTER — Encounter: Payer: Self-pay | Admitting: Family Medicine

## 2011-02-13 DIAGNOSIS — I10 Essential (primary) hypertension: Secondary | ICD-10-CM | POA: Diagnosis not present

## 2011-02-13 DIAGNOSIS — H8110 Benign paroxysmal vertigo, unspecified ear: Secondary | ICD-10-CM | POA: Diagnosis not present

## 2011-02-13 DIAGNOSIS — E785 Hyperlipidemia, unspecified: Secondary | ICD-10-CM | POA: Diagnosis not present

## 2011-02-13 DIAGNOSIS — M109 Gout, unspecified: Secondary | ICD-10-CM | POA: Diagnosis not present

## 2011-02-13 DIAGNOSIS — R3915 Urgency of urination: Secondary | ICD-10-CM | POA: Diagnosis not present

## 2011-02-13 DIAGNOSIS — N19 Unspecified kidney failure: Secondary | ICD-10-CM | POA: Diagnosis not present

## 2011-02-13 LAB — CBC WITH DIFFERENTIAL/PLATELET
Basophils Relative: 0.8 % (ref 0.0–3.0)
Eosinophils Absolute: 0.6 10*3/uL (ref 0.0–0.7)
Eosinophils Relative: 6.9 % — ABNORMAL HIGH (ref 0.0–5.0)
Lymphs Abs: 2.6 10*3/uL (ref 0.7–4.0)
MCHC: 33.6 g/dL (ref 30.0–36.0)
Monocytes Absolute: 0.8 10*3/uL (ref 0.1–1.0)
Neutro Abs: 4.2 10*3/uL (ref 1.4–7.7)

## 2011-02-13 LAB — HEPATIC FUNCTION PANEL
AST: 15 U/L (ref 0–37)
Albumin: 3.8 g/dL (ref 3.5–5.2)
Alkaline Phosphatase: 80 U/L (ref 39–117)
Total Protein: 6.7 g/dL (ref 6.0–8.3)

## 2011-02-13 LAB — POCT URINALYSIS DIPSTICK
Blood, UA: NEGATIVE
Ketones, UA: NEGATIVE
Spec Grav, UA: 1.025
Urobilinogen, UA: 0.2

## 2011-02-13 LAB — LIPID PANEL
Cholesterol: 155 mg/dL (ref 0–200)
HDL: 38.1 mg/dL — ABNORMAL LOW (ref >39.00)
Triglycerides: 99 mg/dL (ref 0.0–149.0)

## 2011-02-13 LAB — TSH: TSH: 2.33 u[IU]/mL (ref 0.35–5.50)

## 2011-02-13 MED ORDER — FUROSEMIDE 20 MG PO TABS: 20.0000 mg | ORAL_TABLET | Freq: Every day | ORAL | Status: DC

## 2011-02-13 MED ORDER — ACEBUTOLOL HCL 200 MG PO CAPS: 200.0000 mg | ORAL_CAPSULE | ORAL | Status: DC

## 2011-02-13 MED ORDER — LISINOPRIL 20 MG PO TABS: 20.0000 mg | ORAL_TABLET | Freq: Every day | ORAL | Status: DC

## 2011-02-13 MED ORDER — METOPROLOL SUCCINATE ER 25 MG PO TB24: 25.0000 mg | ORAL_TABLET | Freq: Every day | ORAL | Status: DC

## 2011-02-13 MED ORDER — ALLOPURINOL 300 MG PO TABS: 300.0000 mg | ORAL_TABLET | Freq: Every day | ORAL | Status: DC

## 2011-02-13 NOTE — Patient Instructions (Signed)
Continued the medications, except increase the lisinopril to 20 mg daily.  Continue the Lasix 20 mg daily.  BP checked daily in the morning and return in 4 weeks for follow-up with the data and the device.  Take the allopurinol daily

## 2011-02-13 NOTE — Progress Notes (Signed)
  Subjective:    Patient ID: Mark Velasquez, male    DOB: 1916-07-28, 78 y.o.   MRN: [id number]  HPI Mark Velasquez is a 78 year old male, single, retired Engineer, civil (consulting), who is considering remarriage to his girlfriend!!!!!!!!!!!!!!!!!!, who comes in today for a general Medicare wellness examination because of a history of hypertension, gallops, coronary artery disease, BPH, without lytic destruction and numerous other problems.  His blood pressure today is 170/80.  He is on a beta blocker 200 mg daily, Lasix, 20 mg daily, lisinopril, 10 mg daily,And Toprol 25 mg daily, pulse is 70 and regular.  He is asymptomatic.  He also takes Flomax .4, up 3 times weekly for BPH, and outlet obstruction, and sees his urologist every 3 weeks for acupuncture.  He sees his cardiologist, Dr. Jens Som, yearly for cardiac evaluation.  He states cardiovascular-wise, he feels normal.  He gets routine eye care, hearing diminished.  He has a hearing aid in his left ear, greater dental care, colonoscopy, none that required at this age, tetanus, 2004, Pneumovax, x 2, seasonal flu shot 2012, declined.  The shingles.  Vaccine.  He walks on a daily basis.  Still is cognitively normal, home health safety reviewed.  New issues identified, no guns in the house, he does have a healthcare power of attorney and living well.    Review of Systems  Constitutional: Negative.   HENT: Positive for hearing loss.   Eyes: Negative.   Respiratory: Negative.   Cardiovascular: Negative.   Gastrointestinal: Negative.   Genitourinary: Negative.   Musculoskeletal: Positive for gait problem.  Skin: Negative.   Neurological: Positive for dizziness.  Hematological: Negative.   Psychiatric/Behavioral: Negative.        Objective:   Physical Exam  Constitutional: He is oriented to person, place, and time. He appears well-developed and well-nourished.  HENT:  Head: Normocephalic and atraumatic.  Right Ear: External ear normal.  Left Ear: External ear  normal.  Nose: Nose normal.  Mouth/Throat: Oropharynx is clear and moist.  Eyes: Conjunctivae and EOM are normal. Pupils are equal, round, and reactive to light.  Neck: Normal range of motion. Neck supple. No JVD present. No tracheal deviation present. No thyromegaly present.  Cardiovascular: Normal rate, regular rhythm, normal heart sounds and intact distal pulses.  Exam reveals no gallop and no friction rub.   No murmur heard. Pulmonary/Chest: Effort normal and breath sounds normal. No stridor. No respiratory distress. He has no wheezes. He has no rales. He exhibits no tenderness.  Abdominal: Soft. Bowel sounds are normal. He exhibits no distension and no mass. There is no tenderness. There is no rebound and no guarding.  Musculoskeletal: Normal range of motion. He exhibits no edema and no tenderness.  Lymphadenopathy:    He has no cervical adenopathy.  Neurological: He is alert and oriented to person, place, and time. He has normal reflexes. No cranial nerve deficit. He exhibits normal muscle tone.  Skin: Skin is warm and dry. No rash noted. No erythema. No pallor.       Total body skin exam shows a multitude of seborrheic keratoses  Psychiatric: He has a normal mood and affect. His behavior is normal. Judgment and thought content normal.          Assessment & Plan:  Hypertension not at goal increase lisinopril to 20 mg daily BP check.  Daily follow-up in 4 weeks.  BPH with outlet obstruction.  Follow-up by urology.  History of hyperuricemia continue allopurinol 300 mg daily

## 2011-02-28 ENCOUNTER — Other Ambulatory Visit: Payer: Self-pay | Admitting: Family Medicine

## 2011-03-06 DIAGNOSIS — R3915 Urgency of urination: Secondary | ICD-10-CM | POA: Diagnosis not present

## 2011-03-06 DIAGNOSIS — R350 Frequency of micturition: Secondary | ICD-10-CM | POA: Diagnosis not present

## 2011-03-13 ENCOUNTER — Ambulatory Visit (INDEPENDENT_AMBULATORY_CARE_PROVIDER_SITE_OTHER): Payer: Medicare Other | Admitting: Family Medicine

## 2011-03-13 ENCOUNTER — Encounter: Payer: Self-pay | Admitting: Family Medicine

## 2011-03-13 VITALS — BP 140/80 | Temp 97.7°F | Wt 170.0 lb

## 2011-03-13 DIAGNOSIS — I10 Essential (primary) hypertension: Secondary | ICD-10-CM | POA: Diagnosis not present

## 2011-03-13 MED ORDER — LISINOPRIL 30 MG PO TABS: 30.0000 mg | ORAL_TABLET | Freq: Every day | ORAL | Status: DC

## 2011-03-13 NOTE — Patient Instructions (Signed)
Increase the lisinopril to 30 mg daily  Continue your other medications  Check your blood pressure daily in the morning  Followup in 6 weeks

## 2011-03-13 NOTE — Progress Notes (Signed)
  Subjective:    Patient ID: Mark Velasquez, male    DOB: [birth year], 78 y.o.   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 who comes in today for followup of hypertension  He's currently on Sectral 200 mg daily, Lasix 20 mg daily, lisinopril 20 mg daily, Toprol 25 mg daily, and one 20 mEq potassium tablet  BP today right arm sitting position 150/70 pulse 70 and regular   Review of Systems General and cardiovascular review of systems otherwise negative    Objective:   Physical Exam  Well-developed well-nourished male in no acute distress BP right arm sitting position 150/70 pulse 70 and regular      Assessment & Plan:  Hypertension not quite at goal increase lisinopril to 30 mg daily BP check every morning followup in 4 weeks

## 2011-03-27 DIAGNOSIS — R3915 Urgency of urination: Secondary | ICD-10-CM | POA: Diagnosis not present

## 2011-03-27 DIAGNOSIS — R350 Frequency of micturition: Secondary | ICD-10-CM | POA: Diagnosis not present

## 2011-04-17 DIAGNOSIS — R3915 Urgency of urination: Secondary | ICD-10-CM | POA: Diagnosis not present

## 2011-04-17 DIAGNOSIS — R350 Frequency of micturition: Secondary | ICD-10-CM | POA: Diagnosis not present

## 2011-05-08 DIAGNOSIS — R3915 Urgency of urination: Secondary | ICD-10-CM | POA: Diagnosis not present

## 2011-05-08 DIAGNOSIS — R350 Frequency of micturition: Secondary | ICD-10-CM | POA: Diagnosis not present

## 2011-05-14 ENCOUNTER — Ambulatory Visit: Payer: Medicare Other | Admitting: Family Medicine

## 2011-05-22 ENCOUNTER — Ambulatory Visit (INDEPENDENT_AMBULATORY_CARE_PROVIDER_SITE_OTHER): Payer: Medicare Other | Admitting: Family Medicine

## 2011-05-22 ENCOUNTER — Encounter: Payer: Self-pay | Admitting: Family Medicine

## 2011-05-22 VITALS — BP 142/78 | Temp 97.3°F | Wt 171.0 lb

## 2011-05-22 DIAGNOSIS — N200 Calculus of kidney: Secondary | ICD-10-CM

## 2011-05-22 DIAGNOSIS — I10 Essential (primary) hypertension: Secondary | ICD-10-CM

## 2011-05-22 NOTE — Patient Instructions (Signed)
Take deep metaproterenol 25 mg one tablet at bedtime now  Continue all your other medications in the morning  Check your blood pressure morning noon and bedtime  Return in one month for followup sooner if any problems

## 2011-05-22 NOTE — Progress Notes (Signed)
  Subjective:    Patient ID: Mark Velasquez, male    DOB: 09-08-1916, 78 y.o.   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 He read the package insert and noted that it caused elevated blood pressure in some people. He's been monitoring his blood pressure at home BP diastolic normal systolic ranges from 140-170 asymptomatic. He takes this for blood pressure medications in the morning   Review of Systems General and cardiovascular review of systems otherwise negative    Objective:   Physical Exam Well-developed well-nourished male in no acute distress BP right arm sitting position 142/78       Assessment & Plan:  Hypertension at goal however some fluctuation question related to new medication plan switch

## 2011-06-05 DIAGNOSIS — R350 Frequency of micturition: Secondary | ICD-10-CM | POA: Diagnosis not present

## 2011-06-05 DIAGNOSIS — R3915 Urgency of urination: Secondary | ICD-10-CM | POA: Diagnosis not present

## 2011-06-21 ENCOUNTER — Encounter: Payer: Self-pay | Admitting: Family Medicine

## 2011-06-21 ENCOUNTER — Ambulatory Visit (INDEPENDENT_AMBULATORY_CARE_PROVIDER_SITE_OTHER): Payer: Medicare Other | Admitting: Family Medicine

## 2011-06-21 VITALS — BP 110/64 | Temp 98.2°F | Wt 170.0 lb

## 2011-06-21 DIAGNOSIS — I10 Essential (primary) hypertension: Secondary | ICD-10-CM | POA: Diagnosis not present

## 2011-06-21 NOTE — Progress Notes (Signed)
  Subjective:    Patient ID: Mark Velasquez, male    DOB: 10-12-1916, 78 y.o.   MRN: [id number]  HPI Mark Velasquez is a 78 year old male who comes in today for reevaluation of hypertension  We have been gradually decreasing his medication because she's developed low blood pressure today's BP 110/64 and is lightheaded when he bends over   Review of Systems    review of systems negative Objective:   Physical Exam Well developed well nourished male no acute distress BP right arm sitting position 110/70 pulse 60 and regular       Assessment & Plan:  Hypotension............ stop the Toprol and Lasix continue the Sectral and lisinopril followup in 3 weeks

## 2011-06-21 NOTE — Patient Instructions (Signed)
Stop the Lasix and Toprol  Take one sectral and 1 lisinopril in the morning  Check your blood pressure daily in the morning  Return in 3 weeks for followup

## 2011-06-26 DIAGNOSIS — R3915 Urgency of urination: Secondary | ICD-10-CM | POA: Diagnosis not present

## 2011-06-26 DIAGNOSIS — R350 Frequency of micturition: Secondary | ICD-10-CM | POA: Diagnosis not present

## 2011-07-16 ENCOUNTER — Ambulatory Visit (INDEPENDENT_AMBULATORY_CARE_PROVIDER_SITE_OTHER): Payer: Medicare Other | Admitting: Family Medicine

## 2011-07-16 ENCOUNTER — Encounter: Payer: Self-pay | Admitting: Family Medicine

## 2011-07-16 VITALS — BP 160/80 | Temp 97.9°F | Wt 169.0 lb

## 2011-07-16 DIAGNOSIS — I10 Essential (primary) hypertension: Secondary | ICD-10-CM

## 2011-07-16 NOTE — Progress Notes (Signed)
  Subjective:    Patient ID: Mark Velasquez, male    DOB: 02/27/1916, 78 y.o.   MRN: [id number]  HPI Mark Velasquez is a 78 year old male who comes in today for followup of hypotension  We have been altering his medication because his blood pressure x-ray dropped too low. Today we get 160/80 right arm sitting position at home he got 135/70. He had stop the Lasix however he still eating a lot of sodium and has swelling of his lower extremities.   Review of Systems    general and cardiovascular review of systems otherwise negative Objective:   Physical Exam Well-developed well-nourished male in no acute distress 2+ edema lower extremities       Assessment & Plan:  Hypertension at goal  Peripheral edema

## 2011-07-16 NOTE — Patient Instructions (Signed)
Salt free diet  Lasix Monday Wednesday Friday  Continue to check your blood pressure daily in the morning  Return in one month for followup sooner if any problems

## 2011-07-17 DIAGNOSIS — R3915 Urgency of urination: Secondary | ICD-10-CM | POA: Diagnosis not present

## 2011-07-17 DIAGNOSIS — R350 Frequency of micturition: Secondary | ICD-10-CM | POA: Diagnosis not present

## 2011-07-20 DIAGNOSIS — H3530 Unspecified macular degeneration: Secondary | ICD-10-CM | POA: Diagnosis not present

## 2011-07-20 DIAGNOSIS — H43399 Other vitreous opacities, unspecified eye: Secondary | ICD-10-CM | POA: Diagnosis not present

## 2011-07-20 DIAGNOSIS — H43819 Vitreous degeneration, unspecified eye: Secondary | ICD-10-CM | POA: Diagnosis not present

## 2011-08-07 DIAGNOSIS — R3915 Urgency of urination: Secondary | ICD-10-CM | POA: Diagnosis not present

## 2011-08-20 ENCOUNTER — Encounter: Payer: Self-pay | Admitting: Family Medicine

## 2011-08-20 ENCOUNTER — Ambulatory Visit (INDEPENDENT_AMBULATORY_CARE_PROVIDER_SITE_OTHER): Payer: Medicare Other | Admitting: Family Medicine

## 2011-08-20 VITALS — BP 140/90 | Temp 97.7°F | Wt 168.0 lb

## 2011-08-20 DIAGNOSIS — I10 Essential (primary) hypertension: Secondary | ICD-10-CM | POA: Diagnosis not present

## 2011-08-20 DIAGNOSIS — N19 Unspecified kidney failure: Secondary | ICD-10-CM

## 2011-08-20 LAB — BASIC METABOLIC PANEL
CO2: 27 mEq/L (ref 19–32)
Calcium: 9.4 mg/dL (ref 8.4–10.5)
Chloride: 107 mEq/L (ref 96–112)
Creatinine, Ser: 1.4 mg/dL (ref 0.4–1.5)
Glucose, Bld: 97 mg/dL (ref 70–99)

## 2011-08-20 NOTE — Progress Notes (Signed)
  Subjective:    Patient ID: Mark Velasquez, male    DOB: 12/27/1916, 78 y.o.   MRN: [id number]  HPI Mark Velasquez is a 78 year old male recently remarried!!!!!!!!!!!! who comes in today for followup of peripheral edema and hypertension and renal failure  We started him on Lasix 20 mg a day because of bilateral peripheral edema. He comes back today for followup. His blood pressure is stable 140/90 the edema is about 75% gone. He still has trace to 1+ edema at the end of the day.  Last GFR was stable and potassium was normal on 20 mEq of potassium once daily   Review of Systems General and cardiovascular review of systems otherwise negative    Objective:   Physical Exam Well-developed well-nourished male in no acute distress  BP right arm sitting position 140/90 examination lower [place] shows 1+ to trace for full edema       Assessment & Plan:  Peripheral edema continue Lasix 20 mg daily check renal function  Hypertension at goal continue current therapy  Renal insufficiency check electrolytes

## 2011-08-20 NOTE — Patient Instructions (Addendum)
Continue the Lasix 20 mg daily  Followup in January 2014 for your annual Medicare wellness exam

## 2011-09-25 DIAGNOSIS — R350 Frequency of micturition: Secondary | ICD-10-CM | POA: Diagnosis not present

## 2011-09-25 DIAGNOSIS — R3915 Urgency of urination: Secondary | ICD-10-CM | POA: Diagnosis not present

## 2011-10-15 DIAGNOSIS — Z23 Encounter for immunization: Secondary | ICD-10-CM | POA: Diagnosis not present

## 2011-10-16 DIAGNOSIS — R3915 Urgency of urination: Secondary | ICD-10-CM | POA: Diagnosis not present

## 2011-10-16 DIAGNOSIS — R350 Frequency of micturition: Secondary | ICD-10-CM | POA: Diagnosis not present

## 2011-11-06 DIAGNOSIS — R3915 Urgency of urination: Secondary | ICD-10-CM | POA: Diagnosis not present

## 2011-11-06 DIAGNOSIS — R350 Frequency of micturition: Secondary | ICD-10-CM | POA: Diagnosis not present

## 2011-11-27 DIAGNOSIS — R3915 Urgency of urination: Secondary | ICD-10-CM | POA: Diagnosis not present

## 2011-12-18 DIAGNOSIS — R350 Frequency of micturition: Secondary | ICD-10-CM | POA: Diagnosis not present

## 2011-12-18 DIAGNOSIS — R3915 Urgency of urination: Secondary | ICD-10-CM | POA: Diagnosis not present

## 2011-12-25 DIAGNOSIS — H3530 Unspecified macular degeneration: Secondary | ICD-10-CM | POA: Diagnosis not present

## 2011-12-25 DIAGNOSIS — H35369 Drusen (degenerative) of macula, unspecified eye: Secondary | ICD-10-CM | POA: Diagnosis not present

## 2012-01-08 DIAGNOSIS — R3915 Urgency of urination: Secondary | ICD-10-CM | POA: Diagnosis not present

## 2012-01-08 DIAGNOSIS — R350 Frequency of micturition: Secondary | ICD-10-CM | POA: Diagnosis not present

## 2012-01-29 DIAGNOSIS — R3915 Urgency of urination: Secondary | ICD-10-CM | POA: Diagnosis not present

## 2012-01-29 DIAGNOSIS — R350 Frequency of micturition: Secondary | ICD-10-CM | POA: Diagnosis not present

## 2012-02-05 ENCOUNTER — Encounter: Payer: Self-pay | Admitting: Family Medicine

## 2012-02-05 ENCOUNTER — Ambulatory Visit (INDEPENDENT_AMBULATORY_CARE_PROVIDER_SITE_OTHER): Payer: Medicare Other | Admitting: Family Medicine

## 2012-02-05 VITALS — BP 150/90 | Temp 98.6°F | Ht 69.5 in | Wt 171.0 lb

## 2012-02-05 DIAGNOSIS — N19 Unspecified kidney failure: Secondary | ICD-10-CM

## 2012-02-05 DIAGNOSIS — Z0000 Encounter for general adult medical examination without abnormal findings: Secondary | ICD-10-CM

## 2012-02-05 DIAGNOSIS — Z23 Encounter for immunization: Secondary | ICD-10-CM

## 2012-02-05 DIAGNOSIS — H6120 Impacted cerumen, unspecified ear: Secondary | ICD-10-CM

## 2012-02-05 DIAGNOSIS — Z8546 Personal history of malignant neoplasm of prostate: Secondary | ICD-10-CM

## 2012-02-05 DIAGNOSIS — M109 Gout, unspecified: Secondary | ICD-10-CM | POA: Diagnosis not present

## 2012-02-05 DIAGNOSIS — I10 Essential (primary) hypertension: Secondary | ICD-10-CM

## 2012-02-05 DIAGNOSIS — N400 Enlarged prostate without lower urinary tract symptoms: Secondary | ICD-10-CM

## 2012-02-05 DIAGNOSIS — N200 Calculus of kidney: Secondary | ICD-10-CM

## 2012-02-05 DIAGNOSIS — I499 Cardiac arrhythmia, unspecified: Secondary | ICD-10-CM

## 2012-02-05 LAB — POCT URINALYSIS DIPSTICK
Glucose, UA: NEGATIVE
Ketones, UA: NEGATIVE
Spec Grav, UA: 1.02

## 2012-02-05 LAB — BASIC METABOLIC PANEL
CO2: 28 mEq/L (ref 19–32)
Calcium: 9.6 mg/dL (ref 8.4–10.5)
Chloride: 107 mEq/L (ref 96–112)
Potassium: 4 mEq/L (ref 3.5–5.1)
Sodium: 142 mEq/L (ref 135–145)

## 2012-02-05 LAB — CBC WITH DIFFERENTIAL/PLATELET
Basophils Relative: 1 % (ref 0.0–3.0)
Eosinophils Relative: 7.1 % — ABNORMAL HIGH (ref 0.0–5.0)
Hemoglobin: 14 g/dL (ref 13.0–17.0)
Lymphocytes Relative: 28.5 % (ref 12.0–46.0)
MCV: 93 fl (ref 78.0–100.0)
Neutrophils Relative %: 55.7 % (ref 43.0–77.0)
RBC: 4.63 Mil/uL (ref 4.22–5.81)
WBC: 9 10*3/uL (ref 4.5–10.5)

## 2012-02-05 LAB — TSH: TSH: 2.97 u[IU]/mL (ref 0.35–5.50)

## 2012-02-05 MED ORDER — ACEBUTOLOL HCL 200 MG PO CAPS: 200.0000 mg | ORAL_CAPSULE | ORAL | Status: DC

## 2012-02-05 MED ORDER — METOPROLOL SUCCINATE ER 50 MG PO TB24: 50.0000 mg | ORAL_TABLET | Freq: Every day | ORAL | Status: DC

## 2012-02-05 MED ORDER — LISINOPRIL 10 MG PO TABS: 10.0000 mg | ORAL_TABLET | Freq: Every day | ORAL | Status: DC

## 2012-02-05 MED ORDER — FUROSEMIDE 40 MG PO TABS: 40.0000 mg | ORAL_TABLET | Freq: Every day | ORAL | Status: DC

## 2012-02-05 MED ORDER — ALLOPURINOL 300 MG PO TABS: 300.0000 mg | ORAL_TABLET | Freq: Every day | ORAL | Status: DC

## 2012-02-05 MED ORDER — POTASSIUM CHLORIDE CRYS ER 20 MEQ PO TBCR: 20.0000 meq | EXTENDED_RELEASE_TABLET | Freq: Every day | ORAL | Status: DC

## 2012-02-05 NOTE — Progress Notes (Signed)
  Subjective:    Patient ID: Mark Velasquez, male    DOB: 06-07-1916, 79 y.o.   MRN: [id number]  HPI [name] is a 79 year old male nonsmoker who comes in today for a Medicare wellness examination because of a history of hypertension, gout, cardiac arrhythmia, BPH with outlet obstruction, history of prostate cancer  His medications reviewed in the been no changes except his Toprol was increased to 50 mg daily. BP 150/90 which is okay for his age of 60.  He states he feels well and has no complaints.  He gets routine eye care, dental care, no longer requires a colonoscopy,  Vaccinations up-to-date except he needs a tetanus booster today  Cognitive function amazingly normal, he walks on her help regular basis home health safety reviewed no issues identified, no guns in the house, he does have a health care power of attorney and living well. Also last year he got married again!!!!!!!!!!!!!!   Review of Systems  Constitutional: Negative.   HENT: Negative.   Eyes: Negative.   Respiratory: Negative.   Cardiovascular: Negative.   Gastrointestinal: Negative.   Genitourinary: Negative.   Musculoskeletal: Negative.   Skin: Negative.   Neurological: Negative.   Hematological: Negative.   Psychiatric/Behavioral: Negative.        Objective:   Physical Exam  Constitutional: He is oriented to person, place, and time. He appears well-developed and well-nourished.  HENT:  Head: Normocephalic and atraumatic.  Right Ear: External ear normal.  Left Ear: External ear normal.  Nose: Nose normal.  Mouth/Throat: Oropharynx is clear and moist.       Left ear canal normal ear wax right ear which was flushed by Fleet Contras  Eyes: Conjunctivae normal and EOM are normal. Pupils are equal, round, and reactive to light.  Neck: Normal range of motion. Neck supple. No JVD present. No tracheal deviation present. No thyromegaly present.  Cardiovascular: Normal rate, regular rhythm, normal heart sounds and intact  distal pulses.  Exam reveals no gallop and no friction rub.   No murmur heard.      No carotid bruits aorta normal peripheral pulses 2+  Pulmonary/Chest: Effort normal and breath sounds normal. No stridor. No respiratory distress. He has no wheezes. He has no rales. He exhibits no tenderness.  Abdominal: Soft. Bowel sounds are normal. He exhibits no distension and no mass. There is no tenderness. There is no rebound and no guarding.  Musculoskeletal: Normal range of motion. He exhibits no edema and no tenderness.  Lymphadenopathy:    He has no cervical adenopathy.  Neurological: He is alert and oriented to person, place, and time. He has normal reflexes. No cranial nerve deficit. He exhibits normal muscle tone.  Skin: Skin is warm and dry. No rash noted. No erythema. No pallor.       He l has 100s of seborrheic keratosis  Psychiatric: He has a normal mood and affect. His behavior is normal. Judgment and thought content normal.          Assessment & Plan:  Amazingly healthy male at age 77  Hypertension continue current medication  Gout continue allopurinol  History prostate cancer with now outlet obstruction and urinary incontinence followup in urology every 3 weeks to doing a combination of treatments including acupuncture and methocarbamol.

## 2012-02-05 NOTE — Patient Instructions (Signed)
Continue your current medications  Followup in 6 months sooner if any problems

## 2012-02-19 DIAGNOSIS — R350 Frequency of micturition: Secondary | ICD-10-CM | POA: Diagnosis not present

## 2012-02-19 DIAGNOSIS — R3915 Urgency of urination: Secondary | ICD-10-CM | POA: Diagnosis not present

## 2012-03-11 DIAGNOSIS — R350 Frequency of micturition: Secondary | ICD-10-CM | POA: Diagnosis not present

## 2012-03-11 DIAGNOSIS — R3915 Urgency of urination: Secondary | ICD-10-CM | POA: Diagnosis not present

## 2012-04-01 DIAGNOSIS — R3915 Urgency of urination: Secondary | ICD-10-CM | POA: Diagnosis not present

## 2012-04-22 DIAGNOSIS — R350 Frequency of micturition: Secondary | ICD-10-CM | POA: Diagnosis not present

## 2012-04-22 DIAGNOSIS — R3915 Urgency of urination: Secondary | ICD-10-CM | POA: Diagnosis not present

## 2012-05-13 DIAGNOSIS — R3915 Urgency of urination: Secondary | ICD-10-CM | POA: Diagnosis not present

## 2012-05-13 DIAGNOSIS — R350 Frequency of micturition: Secondary | ICD-10-CM | POA: Diagnosis not present

## 2012-06-03 DIAGNOSIS — R3915 Urgency of urination: Secondary | ICD-10-CM | POA: Diagnosis not present

## 2012-06-03 DIAGNOSIS — R350 Frequency of micturition: Secondary | ICD-10-CM | POA: Diagnosis not present

## 2012-06-24 DIAGNOSIS — R350 Frequency of micturition: Secondary | ICD-10-CM | POA: Diagnosis not present

## 2012-06-24 DIAGNOSIS — R3915 Urgency of urination: Secondary | ICD-10-CM | POA: Diagnosis not present

## 2012-07-14 ENCOUNTER — Other Ambulatory Visit: Payer: Self-pay | Admitting: *Deleted

## 2012-07-14 DIAGNOSIS — H43399 Other vitreous opacities, unspecified eye: Secondary | ICD-10-CM | POA: Diagnosis not present

## 2012-07-14 DIAGNOSIS — H26499 Other secondary cataract, unspecified eye: Secondary | ICD-10-CM | POA: Diagnosis not present

## 2012-07-14 DIAGNOSIS — H3530 Unspecified macular degeneration: Secondary | ICD-10-CM | POA: Diagnosis not present

## 2012-07-14 DIAGNOSIS — I10 Essential (primary) hypertension: Secondary | ICD-10-CM

## 2012-07-14 DIAGNOSIS — M109 Gout, unspecified: Secondary | ICD-10-CM

## 2012-07-14 MED ORDER — ALLOPURINOL 300 MG PO TABS: 300.0000 mg | ORAL_TABLET | Freq: Every day | ORAL | Status: DC

## 2012-07-14 MED ORDER — LISINOPRIL 10 MG PO TABS: 10.0000 mg | ORAL_TABLET | Freq: Every day | ORAL | Status: DC

## 2012-07-14 MED ORDER — ACEBUTOLOL HCL 200 MG PO CAPS: 200.0000 mg | ORAL_CAPSULE | ORAL | Status: DC

## 2012-07-15 ENCOUNTER — Other Ambulatory Visit: Payer: Self-pay | Admitting: *Deleted

## 2012-07-15 DIAGNOSIS — R3915 Urgency of urination: Secondary | ICD-10-CM | POA: Diagnosis not present

## 2012-07-15 DIAGNOSIS — R350 Frequency of micturition: Secondary | ICD-10-CM | POA: Diagnosis not present

## 2012-07-15 NOTE — Telephone Encounter (Signed)
Opened in error

## 2012-08-04 ENCOUNTER — Ambulatory Visit (INDEPENDENT_AMBULATORY_CARE_PROVIDER_SITE_OTHER): Payer: Medicare Other | Admitting: Family Medicine

## 2012-08-04 ENCOUNTER — Encounter: Payer: Self-pay | Admitting: Family Medicine

## 2012-08-04 VITALS — BP 150/70 | Temp 98.0°F | Wt 168.0 lb

## 2012-08-04 DIAGNOSIS — N200 Calculus of kidney: Secondary | ICD-10-CM

## 2012-08-04 DIAGNOSIS — M25551 Pain in right hip: Secondary | ICD-10-CM

## 2012-08-04 DIAGNOSIS — I10 Essential (primary) hypertension: Secondary | ICD-10-CM | POA: Diagnosis not present

## 2012-08-04 DIAGNOSIS — H833X9 Noise effects on inner ear, unspecified ear: Secondary | ICD-10-CM

## 2012-08-04 DIAGNOSIS — N19 Unspecified kidney failure: Secondary | ICD-10-CM | POA: Diagnosis not present

## 2012-08-04 DIAGNOSIS — I499 Cardiac arrhythmia, unspecified: Secondary | ICD-10-CM

## 2012-08-04 DIAGNOSIS — Z8546 Personal history of malignant neoplasm of prostate: Secondary | ICD-10-CM

## 2012-08-04 DIAGNOSIS — M109 Gout, unspecified: Secondary | ICD-10-CM

## 2012-08-04 DIAGNOSIS — M25559 Pain in unspecified hip: Secondary | ICD-10-CM

## 2012-08-04 DIAGNOSIS — H833X3 Noise effects on inner ear, bilateral: Secondary | ICD-10-CM

## 2012-08-04 MED ORDER — TRAMADOL HCL 50 MG PO TABS: ORAL_TABLET | ORAL | Status: DC

## 2012-08-04 NOTE — Progress Notes (Signed)
  Subjective:    Patient ID: Mark Velasquez, male    DOB: [birth year], 79 y.o.   MRN: [id number]  HPI Lani is in 79 year old male remarried a couple years ago who comes in today for evaluation of hypertension, right hip pain, and hearing loss  His blood pressure on Sectral 200 mg daily, Lasix 40 mg daily, lisinopril 10 mg daily is 150/70 pulse 70 and regular   He states she's had a lot of trouble his right hip. He's had previous surgeon had hip by Dr. Delfin Gant taking over-the-counter medications but he does not recall the name. Advised him because of his blood pressure medication to not take anything over-the-counter Tylenol may be a tramadol bedtime but nothing else. He was cautioned about renal insufficiency and/or failure with OTC medication  He is going [facility]. His hearing is getting much worse she is having difficulty just increasing speech.   Review of Systems Otherwise review of systems basically normal except for a history of gout. He's allopurinol 300 mg daily and gout is quiet    Objective:   Physical Exam Well-developed well-nourished male no acute distress vital signs stable he is afebrile       Assessment & Plan:  Hypertension at goal continue current therapy  History of right hip pain getting worse advised Tylenol 2 tabs 3 times a day tramadol each bedtime or so consult if symptoms get worse  History of gout continue allopurinol  History renal insufficiency check labs  Hearing loss referred to [place] that [facility]

## 2012-08-04 NOTE — Patient Instructions (Addendum)
Continue your current medications  Did not take anything over-the-counter for joint or bone pain,,,,,,,,,,,, 2 Tylenol 3 times daily with food,,,,,,,,,,,, Ultram 50 mg each bedtime when necessary for nighttime pain,,,,,,,, go back to the orthopedist if this does not help  I would recommend he get a [facility] for hearing evaluation  Continue current blood pressure medication  Return in January 2015 for your annual Medicare wellness exam

## 2012-08-19 ENCOUNTER — Encounter (INDEPENDENT_AMBULATORY_CARE_PROVIDER_SITE_OTHER): Payer: Self-pay | Admitting: General Surgery

## 2012-08-19 ENCOUNTER — Telehealth (INDEPENDENT_AMBULATORY_CARE_PROVIDER_SITE_OTHER): Payer: Self-pay | Admitting: General Surgery

## 2012-08-19 ENCOUNTER — Ambulatory Visit (INDEPENDENT_AMBULATORY_CARE_PROVIDER_SITE_OTHER): Payer: Medicare Other | Admitting: General Surgery

## 2012-08-19 VITALS — BP 132/82 | HR 74 | Resp 16 | Ht 69.0 in | Wt 166.8 lb

## 2012-08-19 DIAGNOSIS — N6310 Unspecified lump in the right breast, unspecified quadrant: Secondary | ICD-10-CM

## 2012-08-19 DIAGNOSIS — N63 Unspecified lump in unspecified breast: Secondary | ICD-10-CM | POA: Diagnosis not present

## 2012-08-19 NOTE — Telephone Encounter (Signed)
Spoke with pt and informed him that we set him up to have a MGM and [place] on 09/03/12 at 2:15.

## 2012-08-19 NOTE — Patient Instructions (Signed)
Will call with results of mammogram and ultrasound

## 2012-08-25 NOTE — Progress Notes (Signed)
Subjective:     Patient ID: Mark Velasquez, male   DOB: 07/07/1916, 79 y.o.   MRN: [id number]  HPI We're asked to see the patient in consultation by Dr. Tawanna Cooler to evaluate him for pain in his right breast. The patient is a 79 year old white male who states that several years ago he took a research drug designed to help prevent prostate cancer. The side effect of taking the drug is that he developed breast tissue bilaterally. He also did develop breast cancer which was treated. Over the last 2 months he has developed increasing pressure in the right breast. He has not had any discharge from his nipple on either side.  Review of Systems  Constitutional: Negative.   HENT: Negative.   Eyes: Negative.   Respiratory: Negative.   Cardiovascular: Negative.   Gastrointestinal: Negative.   Endocrine: Negative.   Genitourinary: Negative.   Musculoskeletal: Negative.   Skin: Negative.   Allergic/Immunologic: Negative.   Neurological: Negative.   Hematological: Negative.   Psychiatric/Behavioral: Negative.        Objective:   Physical Exam  Constitutional: He is oriented to person, place, and time. He appears well-developed and well-nourished.  HENT:  Head: Normocephalic and atraumatic.  Eyes: Conjunctivae and EOM are normal. Pupils are equal, round, and reactive to light.  Neck: Normal range of motion. Neck supple.  Cardiovascular: Normal rate, regular rhythm and normal heart sounds.   Pulmonary/Chest: Effort normal and breath sounds normal.  He has some generalized enlargement of the breast tissue bilaterally but more so on the right. There is no discrete mass in either breast. There is no palpable axillary, cervical, or supraclavicular lymphadenopathy  Abdominal: Soft. Bowel sounds are normal.  Musculoskeletal: Normal range of motion.  Neurological: He is alert and oriented to person, place, and time.  Skin: Skin is warm and dry.  Psychiatric: He has a normal mood and affect. His behavior  is normal.       Assessment:     The patient most likely has developed some gynecomastia which is worse on the right secondary to a medication he was taking. To be sure I think it would be reasonable to have him undergo a mammogram and ultrasound of the right breast     Plan:     If the mammogram and ultrasound are negative then I will plan to see him back in 3 months for a repeat exam

## 2012-08-26 DIAGNOSIS — R3915 Urgency of urination: Secondary | ICD-10-CM | POA: Diagnosis not present

## 2012-08-26 DIAGNOSIS — R350 Frequency of micturition: Secondary | ICD-10-CM | POA: Diagnosis not present

## 2012-09-03 ENCOUNTER — Ambulatory Visit
Admission: RE | Admit: 2012-09-03 | Discharge: 2012-09-03 | Disposition: A | Discharge status: Home / Self Care | Payer: Medicare Other | Source: Ambulatory Visit | Attending: General Surgery | Admitting: General Surgery

## 2012-09-03 DIAGNOSIS — N6310 Unspecified lump in the right breast, unspecified quadrant: Secondary | ICD-10-CM

## 2012-09-03 DIAGNOSIS — N62 Hypertrophy of breast: Secondary | ICD-10-CM | POA: Diagnosis not present

## 2012-09-04 ENCOUNTER — Telehealth (INDEPENDENT_AMBULATORY_CARE_PROVIDER_SITE_OTHER): Payer: Self-pay

## 2012-09-04 NOTE — Telephone Encounter (Signed)
Message copied by Brennan Bailey on Thu Sep 04, 2012  2:43 PM ------      Message from: Caleen Essex III      Created: Wed Sep 03, 2012 11:00 PM       Looks like benign gynecomastia ------

## 2012-09-04 NOTE — Telephone Encounter (Signed)
LMOM> MM and US show gynecomastia. Follow up in 3 months.

## 2012-09-05 DIAGNOSIS — Z8546 Personal history of malignant neoplasm of prostate: Secondary | ICD-10-CM | POA: Diagnosis not present

## 2012-09-05 DIAGNOSIS — R350 Frequency of micturition: Secondary | ICD-10-CM | POA: Diagnosis not present

## 2012-09-05 DIAGNOSIS — N3941 Urge incontinence: Secondary | ICD-10-CM | POA: Diagnosis not present

## 2012-09-05 DIAGNOSIS — Z0000 Encounter for general adult medical examination without abnormal findings: Secondary | ICD-10-CM | POA: Diagnosis not present

## 2012-09-16 ENCOUNTER — Other Ambulatory Visit: Payer: Self-pay | Admitting: *Deleted

## 2012-09-16 DIAGNOSIS — I10 Essential (primary) hypertension: Secondary | ICD-10-CM

## 2012-09-16 MED ORDER — METOPROLOL SUCCINATE ER 50 MG PO TB24: 50.0000 mg | ORAL_TABLET | Freq: Every day | ORAL | Status: DC

## 2012-09-23 DIAGNOSIS — R350 Frequency of micturition: Secondary | ICD-10-CM | POA: Diagnosis not present

## 2012-09-23 DIAGNOSIS — R3915 Urgency of urination: Secondary | ICD-10-CM | POA: Diagnosis not present

## 2012-10-01 DIAGNOSIS — H04129 Dry eye syndrome of unspecified lacrimal gland: Secondary | ICD-10-CM | POA: Diagnosis not present

## 2012-10-01 DIAGNOSIS — H3530 Unspecified macular degeneration: Secondary | ICD-10-CM | POA: Diagnosis not present

## 2012-10-01 DIAGNOSIS — H35379 Puckering of macula, unspecified eye: Secondary | ICD-10-CM | POA: Diagnosis not present

## 2012-10-01 DIAGNOSIS — H26499 Other secondary cataract, unspecified eye: Secondary | ICD-10-CM | POA: Diagnosis not present

## 2012-10-28 DIAGNOSIS — R3915 Urgency of urination: Secondary | ICD-10-CM | POA: Diagnosis not present

## 2012-11-18 ENCOUNTER — Ambulatory Visit (INDEPENDENT_AMBULATORY_CARE_PROVIDER_SITE_OTHER): Payer: Medicare Other | Admitting: General Surgery

## 2012-11-18 ENCOUNTER — Encounter (INDEPENDENT_AMBULATORY_CARE_PROVIDER_SITE_OTHER): Payer: Self-pay | Admitting: General Surgery

## 2012-11-18 VITALS — BP 150/80 | HR 72 | Temp 98.9°F | Resp 15 | Ht 69.25 in | Wt 169.6 lb

## 2012-11-18 DIAGNOSIS — N62 Hypertrophy of breast: Secondary | ICD-10-CM | POA: Diagnosis not present

## 2012-11-18 NOTE — Patient Instructions (Signed)
Continue to do regular self exams

## 2012-11-25 NOTE — Progress Notes (Signed)
Subjective:     Patient ID: Mark Velasquez, male   DOB: Jun 21, 1916, 79 y.o.   MRN: [id number]  HPI The patient is a 79 year old white male who appears to have benign gynecomastia secondary to pain medicine he took to try to prevent prostate cancer. He is imaging workup has been negative. He only occasionally has some mild soreness of the right breast. Otherwise he feels well.  Review of Systems  Constitutional: Negative.   HENT: Negative.   Eyes: Negative.   Respiratory: Negative.   Cardiovascular: Negative.   Gastrointestinal: Negative.   Endocrine: Negative.   Genitourinary: Negative.   Musculoskeletal: Negative.   Skin: Negative.   Allergic/Immunologic: Negative.   Neurological: Negative.   Hematological: Negative.   Psychiatric/Behavioral: Negative.        Objective:   Physical Exam  Constitutional: He is oriented to person, place, and time. He appears well-developed and well-nourished.  HENT:  Head: Normocephalic and atraumatic.  Eyes: Conjunctivae and EOM are normal. Pupils are equal, round, and reactive to light.  Neck: Normal range of motion. Neck supple.  Cardiovascular: Normal rate, regular rhythm and normal heart sounds.   Pulmonary/Chest: Effort normal and breath sounds normal.  There is some enlargement of the breast tissue bilaterally but more so on the right. There is no palpable mass in either breast. There is no palpable axillary, supraclavicular, or cervical lymphadenopathy  Abdominal: Soft. Bowel sounds are normal. There is no tenderness.  Musculoskeletal: Normal range of motion.  Lymphadenopathy:    He has no cervical adenopathy.  Neurological: He is alert and oriented to person, place, and time.  Skin: Skin is warm and dry.  Psychiatric: He has a normal mood and affect. His behavior is normal.       Assessment:     The patient appears to have benign gynecomastia that is stable     Plan:     At this point I have offered to see him on a yearly  basis. He would like to return to his medical doctor for followup and call us if anything changes. I will be happy to see him back at any time.

## 2012-12-02 DIAGNOSIS — R3915 Urgency of urination: Secondary | ICD-10-CM | POA: Diagnosis not present

## 2012-12-23 DIAGNOSIS — R3915 Urgency of urination: Secondary | ICD-10-CM | POA: Diagnosis not present

## 2012-12-23 DIAGNOSIS — R350 Frequency of micturition: Secondary | ICD-10-CM | POA: Diagnosis not present

## 2013-01-16 DIAGNOSIS — H35369 Drusen (degenerative) of macula, unspecified eye: Secondary | ICD-10-CM | POA: Diagnosis not present

## 2013-01-16 DIAGNOSIS — H3530 Unspecified macular degeneration: Secondary | ICD-10-CM | POA: Diagnosis not present

## 2013-02-05 ENCOUNTER — Ambulatory Visit (INDEPENDENT_AMBULATORY_CARE_PROVIDER_SITE_OTHER): Payer: Medicare Other | Admitting: Family Medicine

## 2013-02-05 ENCOUNTER — Encounter: Payer: Self-pay | Admitting: Family Medicine

## 2013-02-05 VITALS — BP 170/80 | Temp 98.4°F | Ht 69.5 in | Wt 169.0 lb

## 2013-02-05 DIAGNOSIS — I10 Essential (primary) hypertension: Secondary | ICD-10-CM

## 2013-02-05 DIAGNOSIS — N400 Enlarged prostate without lower urinary tract symptoms: Secondary | ICD-10-CM

## 2013-02-05 DIAGNOSIS — Z0000 Encounter for general adult medical examination without abnormal findings: Secondary | ICD-10-CM | POA: Diagnosis not present

## 2013-02-05 DIAGNOSIS — I499 Cardiac arrhythmia, unspecified: Secondary | ICD-10-CM | POA: Diagnosis not present

## 2013-02-05 DIAGNOSIS — E785 Hyperlipidemia, unspecified: Secondary | ICD-10-CM

## 2013-02-05 DIAGNOSIS — Z23 Encounter for immunization: Secondary | ICD-10-CM | POA: Diagnosis not present

## 2013-02-05 DIAGNOSIS — M109 Gout, unspecified: Secondary | ICD-10-CM

## 2013-02-05 DIAGNOSIS — Z8546 Personal history of malignant neoplasm of prostate: Secondary | ICD-10-CM

## 2013-02-05 DIAGNOSIS — H833X9 Noise effects on inner ear, unspecified ear: Secondary | ICD-10-CM

## 2013-02-05 LAB — CBC WITH DIFFERENTIAL/PLATELET
BASOS ABS: 0.1 10*3/uL (ref 0.0–0.1)
Basophils Relative: 0.7 % (ref 0.0–3.0)
EOS ABS: 0.5 10*3/uL (ref 0.0–0.7)
Eosinophils Relative: 5.6 % — ABNORMAL HIGH (ref 0.0–5.0)
HEMATOCRIT: 42.5 % (ref 39.0–52.0)
HEMOGLOBIN: 14.2 g/dL (ref 13.0–17.0)
LYMPHS ABS: 2.8 10*3/uL (ref 0.7–4.0)
Lymphocytes Relative: 34.8 % (ref 12.0–46.0)
MCHC: 33.3 g/dL (ref 30.0–36.0)
MCV: 93.3 fl (ref 78.0–100.0)
MONO ABS: 0.7 10*3/uL (ref 0.1–1.0)
Monocytes Relative: 8.1 % (ref 3.0–12.0)
NEUTROS ABS: 4.1 10*3/uL (ref 1.4–7.7)
Neutrophils Relative %: 50.8 % (ref 43.0–77.0)
PLATELETS: 261 10*3/uL (ref 150.0–400.0)
RBC: 4.55 Mil/uL (ref 4.22–5.81)
RDW: 15.7 % — AB (ref 11.5–14.6)
WBC: 8.1 10*3/uL (ref 4.5–10.5)

## 2013-02-05 LAB — PSA: PSA: 0.02 ng/mL — AB (ref 0.10–4.00)

## 2013-02-05 LAB — BASIC METABOLIC PANEL
BUN: 26 mg/dL — AB (ref 6–23)
CO2: 25 mEq/L (ref 19–32)
CREATININE: 1.6 mg/dL — AB (ref 0.4–1.5)
Calcium: 9.1 mg/dL (ref 8.4–10.5)
Chloride: 110 mEq/L (ref 96–112)
GFR: 41.74 mL/min — AB (ref >60.00)
GLUCOSE: 113 mg/dL — AB (ref 70–99)
POTASSIUM: 4.2 meq/L (ref 3.5–5.1)
Sodium: 145 mEq/L (ref 135–145)

## 2013-02-05 LAB — POCT URINALYSIS DIPSTICK
Bilirubin, UA: NEGATIVE
Glucose, UA: NEGATIVE
Nitrite, UA: POSITIVE
PH UA: 5.5
SPEC GRAV UA: 1.02
UROBILINOGEN UA: 0.2

## 2013-02-05 MED ORDER — FUROSEMIDE 40 MG PO TABS: 40.0000 mg | ORAL_TABLET | Freq: Every day | ORAL | Status: DC

## 2013-02-05 MED ORDER — ALLOPURINOL 300 MG PO TABS: 300.0000 mg | ORAL_TABLET | Freq: Every day | ORAL | Status: DC

## 2013-02-05 MED ORDER — ACEBUTOLOL HCL 200 MG PO CAPS: 200.0000 mg | ORAL_CAPSULE | ORAL | Status: DC

## 2013-02-05 MED ORDER — LISINOPRIL 10 MG PO TABS: 10.0000 mg | ORAL_TABLET | Freq: Every day | ORAL | Status: DC

## 2013-02-05 MED ORDER — TAMSULOSIN HCL 0.4 MG PO CAPS: ORAL_CAPSULE | ORAL | Status: DC

## 2013-02-05 MED ORDER — POTASSIUM CHLORIDE CRYS ER 20 MEQ PO TBCR: 20.0000 meq | EXTENDED_RELEASE_TABLET | Freq: Every day | ORAL | Status: DC

## 2013-02-05 NOTE — Patient Instructions (Signed)
Continue your current medications  Purchase a OMRON digital pump up blood pressure cuff......... you can get this off of [employer] your blood pressure daily in the morning  Return in 3 weeks for followup with the data and the device  If you need further hearing evaluation I would recommend  [facility]

## 2013-02-05 NOTE — Addendum Note (Signed)
Addended by: Westley Hummer B on: 02/05/2013 04:28 PM   Modules accepted: Orders

## 2013-02-05 NOTE — Progress Notes (Signed)
Pre visit review using our clinic review tool, if applicable. No additional management support is needed unless otherwise documented below in the visit note. 

## 2013-02-05 NOTE — Progress Notes (Signed)
   Subjective:    Patient ID: Mark Velasquez, male    DOB: 12-24-1916, 80 y.o.   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 He says his blood pressure at home sometimes normal sometimes not. He's not checking his blood pressure on a daily basis.  He gets routine eye care, dental care, no need for colonoscopy, vaccinations up-to-date  Cognitive function normal he walks on a regular basis home health safety reviewed no issues identified, no guns in the house, he does have a health care power of attorney and living will  Vaccinations up-to-date,   Review of Systems  Constitutional: Negative.   HENT: Negative.   Eyes: Negative.   Respiratory: Negative.   Cardiovascular: Negative.   Gastrointestinal: Negative.   Endocrine: Negative.   Genitourinary: Negative.   Musculoskeletal: Negative.   Skin: Negative.   Allergic/Immunologic: Negative.   Neurological: Negative.   Hematological: Negative.   Psychiatric/Behavioral: Negative.        Objective:   Physical Exam  Nursing note and vitals reviewed. Constitutional: He is oriented to person, place, and time. He appears well-developed and well-nourished. No distress.  HENT:  Head: Normocephalic and atraumatic.  Right Ear: External ear normal.  Left Ear: External ear normal.  Nose: Nose normal.  Mouth/Throat: Oropharynx is clear and moist.  Eyes: Conjunctivae and EOM are normal. Pupils are equal, round, and reactive to light.  Neck: Normal range of motion. Neck supple. No JVD present. No tracheal deviation present. No thyromegaly present.  Cardiovascular: Normal rate, regular rhythm, normal heart sounds and intact distal  pulses.  Exam reveals no gallop and no friction rub.   No murmur heard. No aortic bruits peripheral pulses 1+ and symmetrical  Pulmonary/Chest: Effort normal and breath sounds normal. No stridor. No respiratory distress. He has no wheezes. He has no rales. He exhibits no tenderness.  Abdominal: Soft. Bowel sounds are normal. He exhibits no distension and no mass. There is no tenderness. There is no rebound and no guarding.  Genitourinary: Rectum normal and penis normal. Guaiac negative stool. No penile tenderness.  He had radiation treatment for prostate cancer. Digital rectal exam shows some scarring from previous radiation otherwise negative guaiac negative  Musculoskeletal: Normal range of motion. He exhibits no edema and no tenderness.  Lymphadenopathy:    He has no cervical adenopathy.  Neurological: He is alert and oriented to person, place, and time. He has normal reflexes. No cranial nerve deficit. He exhibits normal muscle tone.  Skin: Skin is warm and dry. No rash noted. He is not diaphoretic. No erythema. No pallor.  Psychiatric: He has a normal mood and affect. His behavior is normal. Judgment and thought content normal.          Assessment & Plan:Hypertension,,,,,,,,, hypertension  Hypertension,,,,,,,,,,,, BP 170/80,,,,,,,,, not at goal,,,,,,, BP check every morning followup in 3 weeks  History prostate cancer asymptomatic  History of gout asymptomatic continue allopurinol  Significant hearing loss refer to audiology  Cerumen impaction right ear,,,,,,,,,,,, removed with irrigation,

## 2013-02-06 ENCOUNTER — Telehealth: Payer: Self-pay | Admitting: Family Medicine

## 2013-02-06 NOTE — Telephone Encounter (Signed)
Spoke with wife and they have already purchased a blood pressure cuff.

## 2013-02-06 NOTE — Telephone Encounter (Addendum)
Wife would like you to call her to help decide which blood pressure machine she should get. Pt would like to order asap.Thanks!

## 2013-02-25 ENCOUNTER — Telehealth: Payer: Self-pay | Admitting: Family Medicine

## 2013-02-25 NOTE — Telephone Encounter (Signed)
Relevant patient education mailed to patient.  

## 2013-03-02 ENCOUNTER — Encounter: Payer: Self-pay | Admitting: Family Medicine

## 2013-03-02 ENCOUNTER — Ambulatory Visit (INDEPENDENT_AMBULATORY_CARE_PROVIDER_SITE_OTHER): Payer: Medicare Other | Admitting: Family Medicine

## 2013-03-02 VITALS — BP 130/70 | Temp 97.4°F | Wt 169.0 lb

## 2013-03-02 DIAGNOSIS — I10 Essential (primary) hypertension: Secondary | ICD-10-CM | POA: Diagnosis not present

## 2013-03-02 NOTE — Patient Instructions (Signed)
Return tomorrow at 4 PM with your digital blood pressure cuff so we can assess to see if it's accurate

## 2013-03-02 NOTE — Progress Notes (Signed)
   Subjective:    Patient ID: Mark Velasquez, male    DOB: 1916-05-17, 80 y.o.   MRN: [id number]  HPI Mark Velasquez is a 80 year old male who comes in today for followup of hypertension  His blood pressures are home are running in the 1 7588 range diastolic 70 however his blood pressure is 130/70. He states he has a new blood pressure cuff but forgot to bring it with him   Review of Systems    review of systems negative Objective:   Physical Exam Well-developed well-nourished male in no acute distress vital signs stable is afebrile BP 130/70      Assessment & Plan:  Hypertension..........Marland Kitchen return tomorrow at 4 PM with digital blood pressure cuff to assess its accuracy

## 2013-03-03 ENCOUNTER — Telehealth: Payer: Self-pay | Admitting: Family Medicine

## 2013-03-03 NOTE — Telephone Encounter (Signed)
Relevant patient education mailed to patient.  

## 2013-06-01 ENCOUNTER — Ambulatory Visit: Payer: Medicare Other | Admitting: Family Medicine

## 2013-06-04 ENCOUNTER — Ambulatory Visit (INDEPENDENT_AMBULATORY_CARE_PROVIDER_SITE_OTHER): Payer: Medicare Other | Admitting: Family Medicine

## 2013-06-04 ENCOUNTER — Encounter: Payer: Self-pay | Admitting: Family Medicine

## 2013-06-04 VITALS — BP 170/70 | Temp 97.8°F | Wt 170.0 lb

## 2013-06-04 DIAGNOSIS — I10 Essential (primary) hypertension: Secondary | ICD-10-CM

## 2013-06-04 DIAGNOSIS — H833X9 Noise effects on inner ear, unspecified ear: Secondary | ICD-10-CM

## 2013-06-04 MED ORDER — ACEBUTOLOL HCL 200 MG PO CAPS: ORAL_CAPSULE | ORAL | Status: DC

## 2013-06-04 NOTE — Patient Instructions (Signed)
Sectral 200 mg.......... one twice daily  Continue your other medications  Omron  digital pump up blood pressure cuff............ check your blood pressure daily in the morning  Followup in 4-6 weeks  When you return bring a record of all your blood pressure readings and the device

## 2013-06-04 NOTE — Progress Notes (Signed)
   Subjective:    Patient ID: Mark Velasquez, male    DOB: September 15, 1916, 80 y.o.   MRN: [id number]  HPI Mark Velasquez is a 80 year old male who comes in today for followup of hypertension  He takes Sectral 200 mg daily, Lasix 40 mg Monday Wednesday Friday and lisinopril 10 mg daily. Also Toprol 50 mg daily  BP today 170/70 which is consistent with what he is getting at home   Review of Systems    review of systems negative Objective:   Physical Exam Well-developed well-nourished male no acute distress vital signs stable he is afebrile BP right arm sitting position 170/70 pulse 70 and regular       Assessment & Plan:  Hypertension not quite at goal increase Sectral 200 twice a day followup in 4-6 weeks

## 2013-06-04 NOTE — Progress Notes (Signed)
Pre visit review using our clinic review tool, if applicable. No additional management support is needed unless otherwise documented below in the visit note. 

## 2013-07-15 ENCOUNTER — Encounter: Payer: Self-pay | Admitting: Family Medicine

## 2013-07-15 ENCOUNTER — Ambulatory Visit (INDEPENDENT_AMBULATORY_CARE_PROVIDER_SITE_OTHER): Payer: Medicare Other | Admitting: Family Medicine

## 2013-07-15 VITALS — BP 130/80 | Temp 97.5°F | Wt 169.0 lb

## 2013-07-15 DIAGNOSIS — I10 Essential (primary) hypertension: Secondary | ICD-10-CM

## 2013-07-15 NOTE — Patient Instructions (Signed)
Continue current medications. 

## 2013-07-15 NOTE — Progress Notes (Signed)
   Subjective:    Patient ID: Mark Velasquez, male    DOB: 13-Feb-1916, 80 y.o.   MRN: [id number]  HPI Red is a 80 year old male who comes in today for followup of hypertension  He's currently on Lasix 40 mg daily, lisinopril 10 mg daily, Toprol 50 mg daily. BP today 130/80 right arm sitting position   Review of Systems    review of systems negative he is not lightheaded when he stands up Objective:   Physical Exam  Well-developed well-nourished in no acute distress vital signs stable is afebrile BP right arm sitting position 130/80 repeat by me same      Assessment & Plan:

## 2013-08-28 DIAGNOSIS — R350 Frequency of micturition: Secondary | ICD-10-CM | POA: Diagnosis not present

## 2013-10-05 ENCOUNTER — Telehealth: Payer: Self-pay | Admitting: Family Medicine

## 2013-10-05 DIAGNOSIS — H833X9 Noise effects on inner ear, unspecified ear: Secondary | ICD-10-CM

## 2013-10-05 NOTE — Telephone Encounter (Signed)
Patient's wife Mark Velasquez) called and said patient needs a referral for [facility] before they will see him.

## 2013-10-05 NOTE — Telephone Encounter (Signed)
Ok per Dr. Sherren Mocha to refer pt.  Referral placed.

## 2013-10-08 DIAGNOSIS — H9190 Unspecified hearing loss, unspecified ear: Secondary | ICD-10-CM | POA: Diagnosis not present

## 2013-10-12 DIAGNOSIS — Z23 Encounter for immunization: Secondary | ICD-10-CM | POA: Diagnosis not present

## 2014-02-09 ENCOUNTER — Encounter: Payer: Medicare Other | Admitting: Family Medicine

## 2014-03-18 ENCOUNTER — Encounter: Payer: Self-pay | Admitting: Family Medicine

## 2014-03-18 ENCOUNTER — Ambulatory Visit (INDEPENDENT_AMBULATORY_CARE_PROVIDER_SITE_OTHER): Payer: Medicare Other | Admitting: Family Medicine

## 2014-03-18 DIAGNOSIS — I10 Essential (primary) hypertension: Secondary | ICD-10-CM | POA: Diagnosis not present

## 2014-03-18 NOTE — Progress Notes (Signed)
   Subjective:    Patient ID: Mark Velasquez, male    DOB: [birth year], 81 y.o.   MRN: [id number]  HPI Mark Velasquez is a 81 year old remarried male nonsmoker who comes in today for follow-up of multiple issues  He has underlying hypertension well controlled however he has trouble with urinary leakage frequency and inability to control his bladder. He went to see Dr. Vikki Ports at the urology center 6 months ago was given a new medicine which he never got filled. He takes Flomax 0.4 daily. In 1998 he had radiation seeds implanted in his prostate for prostate cancer  He's very tearful today. He's thinking about the end-of-life. He does have a healthcare power of attorney and living well. He's remarried to a woman the same age she has 4 children in the vicinity.   Review of Systems    review of systems negative Objective:   Physical Exam  Well-developed well-nourished male no acute distress vital signs stable he is afebrile BP today 130/84      Assessment & Plan:  Hypertension at goal  History of prostate cancer,,,,, with urinary leakage,,,,,,, discussed options,,,,,,, stop the Lasix for couple weeks and potassium. Monitor blood pressure follow-up in 2 weeks

## 2014-03-18 NOTE — Patient Instructions (Signed)
OMRON pump up digital blood pressure cuff  Check your blood pressure daily in the morning  Return in 2 weeks for follow-up,,,,,,,,, bring a record of all your blood pressure readings and the device  Stop the Lasix and the potassium

## 2014-03-18 NOTE — Progress Notes (Signed)
Pre visit review using our clinic review tool, if applicable. No additional management support is needed unless otherwise documented below in the visit note. 

## 2014-04-01 ENCOUNTER — Ambulatory Visit (INDEPENDENT_AMBULATORY_CARE_PROVIDER_SITE_OTHER): Payer: Medicare Other | Admitting: Family Medicine

## 2014-04-01 VITALS — BP 160/80 | Temp 97.9°F | Wt 165.0 lb

## 2014-04-01 DIAGNOSIS — M25551 Pain in right hip: Secondary | ICD-10-CM

## 2014-04-01 DIAGNOSIS — I10 Essential (primary) hypertension: Secondary | ICD-10-CM | POA: Diagnosis not present

## 2014-04-01 DIAGNOSIS — R351 Nocturia: Secondary | ICD-10-CM | POA: Diagnosis not present

## 2014-04-01 DIAGNOSIS — M10079 Idiopathic gout, unspecified ankle and foot: Secondary | ICD-10-CM | POA: Diagnosis not present

## 2014-04-01 DIAGNOSIS — N401 Enlarged prostate with lower urinary tract symptoms: Secondary | ICD-10-CM

## 2014-04-01 LAB — CBC WITH DIFFERENTIAL/PLATELET
BASOS ABS: 0 10*3/uL (ref 0.0–0.1)
BASOS PCT: 0.8 % (ref 0.0–3.0)
Eosinophils Absolute: 0.5 10*3/uL (ref 0.0–0.7)
Eosinophils Relative: 7.6 % — ABNORMAL HIGH (ref 0.0–5.0)
HCT: 38.2 % — ABNORMAL LOW (ref 39.0–52.0)
Hemoglobin: 12.8 g/dL — ABNORMAL LOW (ref 13.0–17.0)
LYMPHS ABS: 2.2 10*3/uL (ref 0.7–4.0)
Lymphocytes Relative: 35.6 % (ref 12.0–46.0)
MCHC: 33.4 g/dL (ref 30.0–36.0)
MCV: 93.3 fl (ref 78.0–100.0)
MONO ABS: 0.6 10*3/uL (ref 0.1–1.0)
Monocytes Relative: 9.3 % (ref 3.0–12.0)
NEUTROS PCT: 46.7 % (ref 43.0–77.0)
Neutro Abs: 2.9 10*3/uL (ref 1.4–7.7)
PLATELETS: 208 10*3/uL (ref 150.0–400.0)
RBC: 4.09 Mil/uL — ABNORMAL LOW (ref 4.22–5.81)
RDW: 16.2 % — AB (ref 11.5–15.5)
WBC: 6.2 10*3/uL (ref 4.0–10.5)

## 2014-04-01 LAB — BASIC METABOLIC PANEL
BUN: 21 mg/dL (ref 6–23)
CHLORIDE: 109 meq/L (ref 96–112)
CO2: 30 mEq/L (ref 19–32)
Calcium: 9.3 mg/dL (ref 8.4–10.5)
Creatinine, Ser: 1.42 mg/dL (ref 0.40–1.50)
GFR: 49.17 mL/min — ABNORMAL LOW (ref >60.00)
GLUCOSE: 128 mg/dL — AB (ref 70–99)
Potassium: 4.1 mEq/L (ref 3.5–5.1)
Sodium: 141 mEq/L (ref 135–145)

## 2014-04-01 MED ORDER — ACEBUTOLOL HCL 200 MG PO CAPS: ORAL_CAPSULE | ORAL | Status: DC

## 2014-04-01 MED ORDER — TAMSULOSIN HCL 0.4 MG PO CAPS: ORAL_CAPSULE | ORAL | Status: DC

## 2014-04-01 MED ORDER — MIRABEGRON ER 25 MG PO TB24: 25.0000 mg | ORAL_TABLET | Freq: Every day | ORAL | Status: DC

## 2014-04-01 MED ORDER — LISINOPRIL 20 MG PO TABS: 20.0000 mg | ORAL_TABLET | Freq: Every day | ORAL | Status: DC

## 2014-04-01 MED ORDER — TRAMADOL HCL 50 MG PO TABS: ORAL_TABLET | ORAL | Status: DC

## 2014-04-01 MED ORDER — METOPROLOL SUCCINATE ER 50 MG PO TB24: 50.0000 mg | ORAL_TABLET | Freq: Every day | ORAL | Status: DC

## 2014-04-01 MED ORDER — ALLOPURINOL 300 MG PO TABS: 300.0000 mg | ORAL_TABLET | Freq: Every day | ORAL | Status: DC

## 2014-04-01 NOTE — Patient Instructions (Signed)
Increase the lisinopril to 20 mg daily  Continue not to take the Lasix and potassium  Labs today  Blood pressure daily in the morning  Return in 4 weeks for follow-up  Restart the urologic medicine,,,,,,,,, 1 daily

## 2014-04-01 NOTE — Progress Notes (Signed)
   Subjective:    Patient ID: Mark Velasquez, male    DOB: 1916-05-16, 81 y.o.   MRN: [id number]  HPI [name] is a 81 year old male who comes in today for follow-up of hypertension  His blood pressure is about 941 systolic 80 diastolic. We will increase his lisinopril from 10 mg a day to 20. We have stopped the Lasix because of the enuresis. He tells me his urologist gave him a medication that helped but they would not give him any refills.   Review of Systems Review of systems otherwise negative    Objective:   Physical Exam  Well-developed well-nourished male no acute distress vital signs stable he is afebrile BP right arm sitting position 160/80      Assessment & Plan:  Hypertension not at goal.. Increase lisinopril to 20 mg daily follow-up in 6 weeks  Urinary incontinence,,,,,,,,,, continue to avoid Lasix and potassium restart the urologic medicine from his urologist

## 2014-04-01 NOTE — Progress Notes (Signed)
Pre visit review using our clinic review tool, if applicable. No additional management support is needed unless otherwise documented below in the visit note. 

## 2014-05-03 ENCOUNTER — Encounter: Payer: Self-pay | Admitting: Family Medicine

## 2014-05-03 ENCOUNTER — Ambulatory Visit (INDEPENDENT_AMBULATORY_CARE_PROVIDER_SITE_OTHER): Payer: Medicare Other | Admitting: Family Medicine

## 2014-05-03 VITALS — BP 110/60 | Temp 97.7°F | Wt 166.0 lb

## 2014-05-03 DIAGNOSIS — I10 Essential (primary) hypertension: Secondary | ICD-10-CM | POA: Diagnosis not present

## 2014-05-03 NOTE — Progress Notes (Signed)
Pre visit review using our clinic review tool, if applicable. No additional management support is needed unless otherwise documented below in the visit note. 

## 2014-05-03 NOTE — Progress Notes (Signed)
   Subjective:    Patient ID: Mark Velasquez, male    DOB: Jun 03, 1916, 81 y.o.   MRN: [id number]  HPI Mark Velasquez is a 81 year old male who comes in today for reevaluation of hypertension. Because of the urinary incontinence we stopped the Lasix BP still too low 110 over 60s having symptoms of lightheadedness when he stands up.   Review of Systems Review of systems otherwise negative    Objective:   Physical Exam  Well-developed well-nourished thin male no acute distress BP 110/60 right arm sitting position pulse 70 and regular      Assessment & Plan:  Hypertension................... BP too low.......... decrease Sectral to one tablet daily follow-up in 4 weeks.Marland KitchenMarland KitchenMarland Kitchen

## 2014-05-03 NOTE — Patient Instructions (Signed)
Decrease the Sectral 200 mg.......... take only one tablet daily in the morning instead of 2  Lisinopril 20 mg..... One daily  Toprol 50 mg......Marland Kitchen 1 daily  Check your blood pressure daily in the morning  Return in 4 weeks for follow-up

## 2014-05-13 ENCOUNTER — Emergency Department (HOSPITAL_BASED_OUTPATIENT_CLINIC_OR_DEPARTMENT_OTHER)
Admission: EM | Admit: 2014-05-13 | Discharge: 2014-05-13 | Disposition: A | Discharge status: Home / Self Care | Payer: Medicare Other | Attending: Emergency Medicine | Admitting: Emergency Medicine

## 2014-05-13 ENCOUNTER — Encounter (HOSPITAL_BASED_OUTPATIENT_CLINIC_OR_DEPARTMENT_OTHER): Payer: Self-pay | Admitting: *Deleted

## 2014-05-13 ENCOUNTER — Emergency Department (HOSPITAL_BASED_OUTPATIENT_CLINIC_OR_DEPARTMENT_OTHER): Payer: Medicare Other

## 2014-05-13 DIAGNOSIS — Y998 Other external cause status: Secondary | ICD-10-CM | POA: Diagnosis not present

## 2014-05-13 DIAGNOSIS — I129 Hypertensive chronic kidney disease with stage 1 through stage 4 chronic kidney disease, or unspecified chronic kidney disease: Secondary | ICD-10-CM | POA: Diagnosis not present

## 2014-05-13 DIAGNOSIS — Y9389 Activity, other specified: Secondary | ICD-10-CM | POA: Insufficient documentation

## 2014-05-13 DIAGNOSIS — Z8639 Personal history of other endocrine, nutritional and metabolic disease: Secondary | ICD-10-CM | POA: Insufficient documentation

## 2014-05-13 DIAGNOSIS — Z7982 Long term (current) use of aspirin: Secondary | ICD-10-CM | POA: Insufficient documentation

## 2014-05-13 DIAGNOSIS — Z79899 Other long term (current) drug therapy: Secondary | ICD-10-CM | POA: Insufficient documentation

## 2014-05-13 DIAGNOSIS — M109 Gout, unspecified: Secondary | ICD-10-CM | POA: Insufficient documentation

## 2014-05-13 DIAGNOSIS — W1839XA Other fall on same level, initial encounter: Secondary | ICD-10-CM | POA: Diagnosis not present

## 2014-05-13 DIAGNOSIS — S0121XA Laceration without foreign body of nose, initial encounter: Secondary | ICD-10-CM

## 2014-05-13 DIAGNOSIS — N189 Chronic kidney disease, unspecified: Secondary | ICD-10-CM | POA: Diagnosis not present

## 2014-05-13 DIAGNOSIS — N400 Enlarged prostate without lower urinary tract symptoms: Secondary | ICD-10-CM | POA: Diagnosis not present

## 2014-05-13 DIAGNOSIS — S0003XA Contusion of scalp, initial encounter: Secondary | ICD-10-CM | POA: Diagnosis not present

## 2014-05-13 DIAGNOSIS — S0993XA Unspecified injury of face, initial encounter: Secondary | ICD-10-CM | POA: Diagnosis present

## 2014-05-13 DIAGNOSIS — Z8546 Personal history of malignant neoplasm of prostate: Secondary | ICD-10-CM | POA: Insufficient documentation

## 2014-05-13 DIAGNOSIS — Y9289 Other specified places as the place of occurrence of the external cause: Secondary | ICD-10-CM | POA: Diagnosis not present

## 2014-05-13 DIAGNOSIS — W19XXXA Unspecified fall, initial encounter: Secondary | ICD-10-CM

## 2014-05-13 DIAGNOSIS — S022XXA Fracture of nasal bones, initial encounter for closed fracture: Secondary | ICD-10-CM | POA: Diagnosis not present

## 2014-05-13 DIAGNOSIS — S022XXB Fracture of nasal bones, initial encounter for open fracture: Secondary | ICD-10-CM | POA: Insufficient documentation

## 2014-05-13 MED ORDER — LIDOCAINE HCL (PF) 1 % IJ SOLN
5.0000 mL | Freq: Once | INTRAMUSCULAR | Status: AC
  Administered 2014-05-13 (×2): 5 mL

## 2014-05-13 MED ORDER — CEPHALEXIN 500 MG PO CAPS
500.0000 mg | ORAL_CAPSULE | Freq: Three times a day (TID) | ORAL | Status: DC
Start: 2014-05-13 — End: 2014-06-01

## 2014-05-13 MED ORDER — LIDOCAINE HCL (PF) 1 % IJ SOLN
INTRAMUSCULAR | Status: AC
  Administered 2014-05-13: 5 mL
  Filled 2014-05-13: qty 5

## 2014-05-13 NOTE — ED Provider Notes (Signed)
CSN: [id number]     Arrival date & time 05/13/14  1516 History   First MD Initiated Contact with Patient 05/13/14 1608     Chief Complaint  Patient presents with  . Fall  . Facial Injury     (Consider location/radiation/quality/duration/timing/severity/associated sxs/prior Treatment) HPI Comments: [place] today. No preceding chest pain, dizziness, shortness of breath. History of falls. States he feels like he is walking and then he gets going to fasten and falls. This is chronic and has happened numerous times over the past 10 years. Tetanus is up-to-date. No loss of conscious. He required assistance to get back up but has been ambulatory since.  Patient is a 81 y.o. male presenting with fall and facial injury.  Fall This is a recurrent problem. The current episode started 1 to 2 hours ago. Episode frequency: once. The problem has not changed since onset.Pertinent negatives include no abdominal pain and no shortness of breath. Nothing aggravates the symptoms. Nothing relieves the symptoms.  Facial Injury Associated symptoms: no vomiting     Past Medical History  Diagnosis Date  . Hypertension   . Dyslipidemia   . Chronic kidney disease     renal failure  . Gout   . BPH (benign prostatic hyperplasia)   . Prostate cancer   . Hip fracture    Past Surgical History  Procedure Laterality Date  . Orif hip fracture      right  . Vertebroplasty    . Appendectomy    . Tonsillectomy    . Prostatectomy    . Cystoscopy      bladder neck contracture   Family History  Problem Relation Age of Onset  . Hypertension Mother   . Pneumonia Mother   . Stroke Father   . Parkinsonism Father    History  Substance Use Topics  . Smoking status: Never Smoker   . Smokeless tobacco: Not on file  . Alcohol Use: No    Review of Systems  Constitutional: Negative for fever.  Respiratory: Negative for cough and shortness of breath.   Gastrointestinal: Negative for vomiting and abdominal pain.    All other systems reviewed and are negative.     Allergies  Review of patient's allergies indicates no known allergies.  Home Medications   Prior to Admission medications   Medication Sig Start Date End Date Taking? Authorizing Provider  acebutolol (SECTRAL) 200 MG capsule 1 by mouth twice a day 04/01/14   Dorena Cookey, MD  allopurinol (ZYLOPRIM) 300 MG tablet Take 1 tablet (300 mg total) by mouth daily. 04/01/14   Dorena Cookey, MD  aspirin 81 MG tablet Take 81 mg by mouth daily.      Historical Provider, MD  beta carotene 10000 UNIT capsule Take 10,000 Units by mouth daily.    Historical Provider, MD  Calcium Carbonate-Vitamin D 500-50 MG-UNIT CAPS Take 500 tablets by mouth daily.      Historical Provider, MD  docusate sodium (COLACE) 100 MG capsule Take 100 mg by mouth. As needed     Historical Provider, MD  ferrous sulfate 325 (65 FE) MG tablet Take 325 mg by mouth daily with breakfast.    Historical Provider, MD  fish oil-omega-3 fatty acids 1000 MG capsule Take 1 g by mouth daily.     Historical Provider, MD  furosemide (LASIX) 40 MG tablet Take 1 tablet (40 mg total) by mouth daily. Patient not taking: Reported on 04/01/2014 02/05/13   Dorena Cookey, MD  lisinopril (PRINIVIL,ZESTRIL)  20 MG tablet Take 1 tablet (20 mg total) by mouth daily. 04/01/14   Dorena Cookey, MD  Lutein 20 MG CAPS Take 1 capsule by mouth daily.      Historical Provider, MD  methocarbamol (ROBAXIN) 500 MG tablet Take 500 mg by mouth 4 (four) times daily.    Historical Provider, MD  metoprolol succinate (TOPROL-XL) 50 MG 24 hr tablet Take 1 tablet (50 mg total) by mouth daily. Take with or immediately following a meal. 04/01/14   Dorena Cookey, MD  mirabegron ER (MYRBETRIQ) 25 MG TB24 tablet Take 1 tablet (25 mg total) by mouth daily. 04/01/14   Dorena Cookey, MD  Multiple Vitamins-Minerals (ICAPS) CAPS Take by mouth as directed.      Historical Provider, MD  niacin 500 MG tablet Take 500 mg by mouth daily  with breakfast.      Historical Provider, MD  Propylene Glycol 0.95 % SOLN Apply to eye.    Historical Provider, MD  tamsulosin (FLOMAX) 0.4 MG CAPS capsule TAKE ONE CAPSULE BY MOUTH EVERY DAY 04/01/14   Dorena Cookey, MD  traMADol Veatrice Bourbon) 50 MG tablet 1 by mouth each bedtime when necessary for pain 04/01/14   Dorena Cookey, MD   BP 168/75 mmHg  Pulse 66  Temp(Src) 97.8 F (36.6 C) (Oral)  Resp 18  Ht 5' 9.5" (1.765 m)  Wt 166 lb (75.297 kg)  BMI 24.17 kg/m2  SpO2 100% Physical Exam  Constitutional: He is oriented to person, place, and time. He appears well-developed and well-nourished. No distress.  HENT:  Head: Normocephalic.    Mouth/Throat: No oropharyngeal exudate.  Eyes: EOM are normal. Pupils are equal, round, and reactive to light.  Neck: Normal range of motion. Neck supple.  Cardiovascular: Normal rate and regular rhythm.  Exam reveals no friction rub.   No murmur heard. Pulmonary/Chest: Effort normal and breath sounds normal. No respiratory distress. He has no wheezes. He has no rales.  Abdominal: Soft. He exhibits no distension. There is no tenderness. There is no rebound.  Musculoskeletal: Normal range of motion. He exhibits no edema.  Neurological: He is alert and oriented to person, place, and time.  Skin: No rash noted. He is not diaphoretic.  Nursing note and vitals reviewed.   ED Course  Procedures (including critical care time) Labs Review Labs Reviewed - No data to display  Imaging Review Ct Head Wo Contrast  05/13/2014   CLINICAL DATA:  Fall walking down stairs face first on to ground. Laceration across nose and abrasion to forehead.  EXAM: CT HEAD WITHOUT CONTRAST  CT MAXILLOFACIAL WITHOUT CONTRAST  TECHNIQUE: Multidetector CT imaging of the head and maxillofacial structures were performed using the standard protocol without intravenous contrast. Multiplanar CT image reconstructions of the maxillofacial structures were also generated.  COMPARISON:  None.   FINDINGS: CT HEAD FINDINGS  Ventricles, cisterns and other CSF spaces are within normal. There is no mass, mass effect, shift of midline structures or acute hemorrhage. There is mild chronic ischemic microvascular disease. No evidence of acute infarction. Small right frontal scalp contusion. Soft tissue swelling over the nasal bridge with small focal soft tissue laceration. Minimally displaced fracture of the nasal septum/vomer.  CT MAXILLOFACIAL FINDINGS  Small right frontal scalp contusion. Mild soft tissue swelling with subtle focal laceration over the nasal bridge. Subtle focal irregularity over the distal aspect of the nasal bone without definite fracture. Nondisplaced fracture of the nasal septum/vomer. No additional fractures identified.  Visualized orbits  are normal symmetric. Paranasal sinuses and mastoid air cells are clear. Degenerative change of the spine.  IMPRESSION: No acute intracranial findings.  Chronic ischemic microvascular disease.  Small right frontal scalp contusion.  Minimally displaced fracture of the nasal septum/vomer. Soft tissue swelling with focal laceration over the nasal bridge. No definite nasal bone fracture.   Electronically Signed   By: Marin Olp M.D.   On: 05/13/2014 17:25   Ct Maxillofacial Wo Cm  05/13/2014   CLINICAL DATA:  Fall walking down stairs face first on to ground. Laceration across nose and abrasion to forehead.  EXAM: CT HEAD WITHOUT CONTRAST  CT MAXILLOFACIAL WITHOUT CONTRAST  TECHNIQUE: Multidetector CT imaging of the head and maxillofacial structures were performed using the standard protocol without intravenous contrast. Multiplanar CT image reconstructions of the maxillofacial structures were also generated.  COMPARISON:  None.  FINDINGS: CT HEAD FINDINGS  Ventricles, cisterns and other CSF spaces are within normal. There is no mass, mass effect, shift of midline structures or acute hemorrhage. There is mild chronic ischemic microvascular disease. No  evidence of acute infarction. Small right frontal scalp contusion. Soft tissue swelling over the nasal bridge with small focal soft tissue laceration. Minimally displaced fracture of the nasal septum/vomer.  CT MAXILLOFACIAL FINDINGS  Small right frontal scalp contusion. Mild soft tissue swelling with subtle focal laceration over the nasal bridge. Subtle focal irregularity over the distal aspect of the nasal bone without definite fracture. Nondisplaced fracture of the nasal septum/vomer. No additional fractures identified.  Visualized orbits are normal symmetric. Paranasal sinuses and mastoid air cells are clear. Degenerative change of the spine.  IMPRESSION: No acute intracranial findings.  Chronic ischemic microvascular disease.  Small right frontal scalp contusion.  Minimally displaced fracture of the nasal septum/vomer. Soft tissue swelling with focal laceration over the nasal bridge. No definite nasal bone fracture.   Electronically Signed   By: Marin Olp M.D.   On: 05/13/2014 17:25     EKG Interpretation None      MDM   Final diagnoses:  Laceration of nose  Fall, initial encounter  Nasal fracture, open, initial encounter    Fick 81 year old male fell today and landed on his face. No loss of consciousness. He's had a history of falls for 10 years. He thinks it's beginning stages of parkinsonism because his family had Parkinson's. He states he is walking and then will have times when he feels like he is going to fasten and falls forward. He did not put his hands on stop his fall. He sustained a laceration to the anterior nose, oriented vertically. Mild nose bleeding that has subsided. Here vitals are stable. Has abrasion above the right eye and 4 cm lack with some dirt contamination on anterior nose. We'll scan his head and face look for possible nasal fracture and intracranial bleeding since he is on aspirin. Tetanus is up to date. Laceration repair by Glendell Docker. CT shows nasal  fracture of septum, but not of nasal bone. Given keflex and ENT f/u.    Evelina Bucy, MD 05/14/14 [id number]

## 2014-05-13 NOTE — Discharge Instructions (Signed)
Facial Laceration ° A facial laceration is a cut on the face. These injuries can be painful and cause bleeding. Lacerations usually heal quickly, but they need special care to reduce scarring. °DIAGNOSIS  °Your health care provider will take a medical history, ask for details about how the injury occurred, and examine the wound to determine how deep the cut is. °TREATMENT  °Some facial lacerations may not require closure. Others may not be able to be closed because of an increased risk of infection. The risk of infection and the chance for successful closure will depend on various factors, including the amount of time since the injury occurred. °The wound may be cleaned to help prevent infection. If closure is appropriate, pain medicines may be given if needed. Your health care provider will use stitches (sutures), wound glue (adhesive), or skin adhesive strips to repair the laceration. These tools bring the skin edges together to allow for faster healing and a better cosmetic outcome. If needed, you may also be given a tetanus shot. °HOME CARE INSTRUCTIONS °· Only take over-the-counter or prescription medicines as directed by your health care provider. °· Follow your health care provider's instructions for wound care. These instructions will vary depending on the technique used for closing the wound. °For Sutures: °· Keep the wound clean and dry.   °· If you were given a bandage (dressing), you should change it at least once a day. Also change the dressing if it becomes wet or dirty, or as directed by your health care provider.   °· Wash the wound with soap and water 2 times a day. Rinse the wound off with water to remove all soap. Pat the wound dry with a clean towel.   °· After cleaning, apply a thin layer of the antibiotic ointment recommended by your health care provider. This will help prevent infection and keep the dressing from sticking.   °· You may shower as usual after the first 24 hours. Do not soak the  wound in water until the sutures are removed.   °· Get your sutures removed as directed by your health care provider. With facial lacerations, sutures should usually be taken out after 4-5 days to avoid stitch marks.   °· Wait a few days after your sutures are removed before applying any makeup. °For Skin Adhesive Strips: °· Keep the wound clean and dry.   °· Do not get the skin adhesive strips wet. You may bathe carefully, using caution to keep the wound dry.   °· If the wound gets wet, pat it dry with a clean towel.   °· Skin adhesive strips will fall off on their own. You may trim the strips as the wound heals. Do not remove skin adhesive strips that are still stuck to the wound. They will fall off in time.   °For Wound Adhesive: °· You may briefly wet your wound in the shower or bath. Do not soak or scrub the wound. Do not swim. Avoid periods of heavy sweating until the skin adhesive has fallen off on its own. After showering or bathing, gently pat the wound dry with a clean towel.   °· Do not apply liquid medicine, cream medicine, ointment medicine, or makeup to your wound while the skin adhesive is in place. This may loosen the film before your wound is healed.   °· If a dressing is placed over the wound, be careful not to apply tape directly over the skin adhesive. This may cause the adhesive to be pulled off before the wound is healed.   °· Avoid   prolonged exposure to sunlight or tanning lamps while the skin adhesive is in place.  The skin adhesive will usually remain in place for 5-10 days, then naturally fall off the skin. Do not pick at the adhesive film.  After Healing: Once the wound has healed, cover the wound with sunscreen during the day for 1 full year. This can help minimize scarring. Exposure to ultraviolet light in the first year will darken the scar. It can take 1-2 years for the scar to lose its redness and to heal completely.  SEEK IMMEDIATE MEDICAL CARE IF:  You have redness, pain, or  swelling around the wound.   You see ayellowish-white fluid (pus) coming from the wound.   You have chills or a fever.  MAKE SURE YOU:  Understand these instructions.  Will watch your condition.  Will get help right away if you are not doing well or get worse. Document Released: 02/16/2004 Document Revised: 10/29/2012 Document Reviewed: 08/21/2012 [facility] Patient Information 2015 [place], [place]. This information is not intended to replace advice given to you by your health care provider. Make sure you discuss any questions you have with your health care provider.  Nasal Fracture A nasal fracture is a break or crack in the bones of the nose. A minor break usually heals in a month. You often will receive black eyes from a nasal fracture. This is not a cause for concern. The black eyes will go away over 1 to 2 weeks.  DIAGNOSIS  Your caregiver may want to examine you if you are concerned about a fracture of the nose. X-rays of the nose may not show a nasal fracture even when one is present. Sometimes your caregiver must wait 1 to 5 days after the injury to re-check the nose for alignment and to take additional X-rays. Sometimes the caregiver must wait until the swelling has gone down. TREATMENT Minor fractures that have caused no deformity often do not require treatment. More serious fractures where bones are displaced may require surgery. This will take place after the swelling is gone. Surgery will stabilize and align the fracture. HOME CARE INSTRUCTIONS   Put ice on the injured area.  Put ice in a plastic bag.  Place a towel between your skin and the bag.  Leave the ice on for 15-20 minutes, 03-04 times a day.  Take medications as directed by your caregiver.  Only take over-the-counter or prescription medicines for pain, discomfort, or fever as directed by your caregiver.  If your nose starts bleeding, squeeze the soft parts of the nose against the center wall while you are  sitting in an upright position for 10 minutes.  Contact sports should be avoided for at least 3 to 4 weeks or as directed by your caregiver. SEEK MEDICAL CARE IF:  Your pain increases or becomes severe.  You continue to have nosebleeds.  The shape of your nose does not return to normal within 5 days.  You have pus draining from the nose. SEEK IMMEDIATE MEDICAL CARE IF:   You have bleeding from your nose that does not stop after 20 minutes of pinching the nostrils closed and keeping ice on the nose.  You have clear fluid draining from your nose.  You notice a grape-like swelling on the dividing wall between the nostrils (septum). This is a collection of blood (hematoma) that must be drained to help prevent infection.  You have difficulty moving your eyes.  You have recurrent vomiting. Document Released: 01/06/2000 Document Revised: 04/02/2011  Document Reviewed: 04/24/2010 [facility] Patient Information 2015 [place]. This information is not intended to replace advice given to you by your health care provider. Make sure you discuss any questions you have with your health care provider.

## 2014-05-13 NOTE — ED Notes (Signed)
Stumbled and fell face first on the dirt ground. Laceration across his nose and abrasion to his forehead. No LOC.

## 2014-05-13 NOTE — ED Provider Notes (Signed)
CSN: [id number]     Arrival date & time 05/13/14  1516 History   First MD Initiated Contact with Patient 05/13/14 1608     Chief Complaint  Patient presents with  . Fall  . Facial Injury     (Consider location/radiation/quality/duration/timing/severity/associated sxs/prior Treatment) HPI  Past Medical History  Diagnosis Date  . Hypertension   . Dyslipidemia   . Chronic kidney disease     renal failure  . Gout   . BPH (benign prostatic hyperplasia)   . Prostate cancer   . Hip fracture    Past Surgical History  Procedure Laterality Date  . Orif hip fracture      right  . Vertebroplasty    . Appendectomy    . Tonsillectomy    . Prostatectomy    . Cystoscopy      bladder neck contracture   Family History  Problem Relation Age of Onset  . Hypertension Mother   . Pneumonia Mother   . Stroke Father   . Parkinsonism Father    History  Substance Use Topics  . Smoking status: Never Smoker   . Smokeless tobacco: Not on file  . Alcohol Use: No    Review of Systems    Allergies  Review of patient's allergies indicates no known allergies.  Home Medications   Prior to Admission medications   Medication Sig Start Date End Date Taking? Authorizing Provider  acebutolol (SECTRAL) 200 MG capsule 1 by mouth twice a day 04/01/14   Dorena Cookey, MD  allopurinol (ZYLOPRIM) 300 MG tablet Take 1 tablet (300 mg total) by mouth daily. 04/01/14   Dorena Cookey, MD  aspirin 81 MG tablet Take 81 mg by mouth daily.      Historical Provider, MD  beta carotene 10000 UNIT capsule Take 10,000 Units by mouth daily.    Historical Provider, MD  Calcium Carbonate-Vitamin D 500-50 MG-UNIT CAPS Take 500 tablets by mouth daily.      Historical Provider, MD  docusate sodium (COLACE) 100 MG capsule Take 100 mg by mouth. As needed     Historical Provider, MD  ferrous sulfate 325 (65 FE) MG tablet Take 325 mg by mouth daily with breakfast.    Historical Provider, MD  fish oil-omega-3 fatty acids  1000 MG capsule Take 1 g by mouth daily.     Historical Provider, MD  furosemide (LASIX) 40 MG tablet Take 1 tablet (40 mg total) by mouth daily. Patient not taking: Reported on 04/01/2014 02/05/13   Dorena Cookey, MD  lisinopril (PRINIVIL,ZESTRIL) 20 MG tablet Take 1 tablet (20 mg total) by mouth daily. 04/01/14   Dorena Cookey, MD  Lutein 20 MG CAPS Take 1 capsule by mouth daily.      Historical Provider, MD  methocarbamol (ROBAXIN) 500 MG tablet Take 500 mg by mouth 4 (four) times daily.    Historical Provider, MD  metoprolol succinate (TOPROL-XL) 50 MG 24 hr tablet Take 1 tablet (50 mg total) by mouth daily. Take with or immediately following a meal. 04/01/14   Dorena Cookey, MD  mirabegron ER (MYRBETRIQ) 25 MG TB24 tablet Take 1 tablet (25 mg total) by mouth daily. 04/01/14   Dorena Cookey, MD  Multiple Vitamins-Minerals (ICAPS) CAPS Take by mouth as directed.      Historical Provider, MD  niacin 500 MG tablet Take 500 mg by mouth daily with breakfast.      Historical Provider, MD  Propylene Glycol 0.95 % SOLN Apply to  eye.    Historical Provider, MD  tamsulosin (FLOMAX) 0.4 MG CAPS capsule TAKE ONE CAPSULE BY MOUTH EVERY DAY 04/01/14   Dorena Cookey, MD  traMADol Veatrice Bourbon) 50 MG tablet 1 by mouth each bedtime when necessary for pain 04/01/14   Dorena Cookey, MD   BP 190/100 mmHg  Pulse 79  Temp(Src) 97.6 F (36.4 C) (Oral)  Resp 18  Ht 5' 9.5" (1.765 m)  Wt 166 lb (75.297 kg)  BMI 24.17 kg/m2  SpO2 100% Physical Exam  ED Course  LACERATION REPAIR Date/Time: 05/13/2014 6:29 PM Performed by: Glendell Docker Authorized by: Glendell Docker Consent: Verbal consent obtained. Risks and benefits: risks, benefits and alternatives were discussed Consent given by: patient Patient identity confirmed: verbally with patient Body area: head/neck Location details: nose Laceration length: 3 cm Contamination: The wound is contaminated. Foreign bodies: no foreign bodies Anesthesia: local  infiltration Local anesthetic: lidocaine 1% without epinephrine Irrigation solution: saline Skin closure: 6-0 Prolene Number of sutures: 7 Technique: simple Approximation: close Approximation difficulty: simple Dressing: 4x4 sterile gauze Patient tolerance: Patient tolerated the procedure well with no immediate complications   (including critical care time) Labs Review Labs Reviewed - No data to display  Imaging Review Ct Head Wo Contrast  05/13/2014   CLINICAL DATA:  Fall walking down stairs face first on to ground. Laceration across nose and abrasion to forehead.  EXAM: CT HEAD WITHOUT CONTRAST  CT MAXILLOFACIAL WITHOUT CONTRAST  TECHNIQUE: Multidetector CT imaging of the head and maxillofacial structures were performed using the standard protocol without intravenous contrast. Multiplanar CT image reconstructions of the maxillofacial structures were also generated.  COMPARISON:  None.  FINDINGS: CT HEAD FINDINGS  Ventricles, cisterns and other CSF spaces are within normal. There is no mass, mass effect, shift of midline structures or acute hemorrhage. There is mild chronic ischemic microvascular disease. No evidence of acute infarction. Small right frontal scalp contusion. Soft tissue swelling over the nasal bridge with small focal soft tissue laceration. Minimally displaced fracture of the nasal septum/vomer.  CT MAXILLOFACIAL FINDINGS  Small right frontal scalp contusion. Mild soft tissue swelling with subtle focal laceration over the nasal bridge. Subtle focal irregularity over the distal aspect of the nasal bone without definite fracture. Nondisplaced fracture of the nasal septum/vomer. No additional fractures identified.  Visualized orbits are normal symmetric. Paranasal sinuses and mastoid air cells are clear. Degenerative change of the spine.  IMPRESSION: No acute intracranial findings.  Chronic ischemic microvascular disease.  Small right frontal scalp contusion.  Minimally displaced  fracture of the nasal septum/vomer. Soft tissue swelling with focal laceration over the nasal bridge. No definite nasal bone fracture.   Electronically Signed   By: Marin Olp M.D.   On: 05/13/2014 17:25   Ct Maxillofacial Wo Cm  05/13/2014   CLINICAL DATA:  Fall walking down stairs face first on to ground. Laceration across nose and abrasion to forehead.  EXAM: CT HEAD WITHOUT CONTRAST  CT MAXILLOFACIAL WITHOUT CONTRAST  TECHNIQUE: Multidetector CT imaging of the head and maxillofacial structures were performed using the standard protocol without intravenous contrast. Multiplanar CT image reconstructions of the maxillofacial structures were also generated.  COMPARISON:  None.  FINDINGS: CT HEAD FINDINGS  Ventricles, cisterns and other CSF spaces are within normal. There is no mass, mass effect, shift of midline structures or acute hemorrhage. There is mild chronic ischemic microvascular disease. No evidence of acute infarction. Small right frontal scalp contusion. Soft tissue swelling over the nasal bridge with small  focal soft tissue laceration. Minimally displaced fracture of the nasal septum/vomer.  CT MAXILLOFACIAL FINDINGS  Small right frontal scalp contusion. Mild soft tissue swelling with subtle focal laceration over the nasal bridge. Subtle focal irregularity over the distal aspect of the nasal bone without definite fracture. Nondisplaced fracture of the nasal septum/vomer. No additional fractures identified.  Visualized orbits are normal symmetric. Paranasal sinuses and mastoid air cells are clear. Degenerative change of the spine.  IMPRESSION: No acute intracranial findings.  Chronic ischemic microvascular disease.  Small right frontal scalp contusion.  Minimally displaced fracture of the nasal septum/vomer. Soft tissue swelling with focal laceration over the nasal bridge. No definite nasal bone fracture.   Electronically Signed   By: Marin Olp M.D.   On: 05/13/2014 17:25     EKG  Interpretation None      MDM   Final diagnoses:  Laceration of nose        Glendell Docker, NP 05/13/14 1830  Evelina Bucy, MD 05/14/14 [phone]

## 2014-05-13 NOTE — ED Notes (Signed)
Suture cart at bedside 

## 2014-05-18 DIAGNOSIS — S022XXA Fracture of nasal bones, initial encounter for closed fracture: Secondary | ICD-10-CM | POA: Diagnosis not present

## 2014-05-18 DIAGNOSIS — S0121XA Laceration without foreign body of nose, initial encounter: Secondary | ICD-10-CM | POA: Diagnosis not present

## 2014-05-21 ENCOUNTER — Encounter (HOSPITAL_BASED_OUTPATIENT_CLINIC_OR_DEPARTMENT_OTHER): Payer: Self-pay

## 2014-05-21 ENCOUNTER — Emergency Department (HOSPITAL_BASED_OUTPATIENT_CLINIC_OR_DEPARTMENT_OTHER)
Admission: EM | Admit: 2014-05-21 | Discharge: 2014-05-21 | Disposition: A | Discharge status: Home / Self Care | Payer: Medicare Other | Attending: Emergency Medicine | Admitting: Emergency Medicine

## 2014-05-21 DIAGNOSIS — Z79899 Other long term (current) drug therapy: Secondary | ICD-10-CM | POA: Insufficient documentation

## 2014-05-21 DIAGNOSIS — Z8639 Personal history of other endocrine, nutritional and metabolic disease: Secondary | ICD-10-CM | POA: Insufficient documentation

## 2014-05-21 DIAGNOSIS — Z4802 Encounter for removal of sutures: Secondary | ICD-10-CM | POA: Insufficient documentation

## 2014-05-21 DIAGNOSIS — M109 Gout, unspecified: Secondary | ICD-10-CM | POA: Insufficient documentation

## 2014-05-21 DIAGNOSIS — Z87438 Personal history of other diseases of male genital organs: Secondary | ICD-10-CM | POA: Insufficient documentation

## 2014-05-21 DIAGNOSIS — Z8546 Personal history of malignant neoplasm of prostate: Secondary | ICD-10-CM | POA: Diagnosis not present

## 2014-05-21 DIAGNOSIS — Z7982 Long term (current) use of aspirin: Secondary | ICD-10-CM | POA: Insufficient documentation

## 2014-05-21 DIAGNOSIS — Z8781 Personal history of (healed) traumatic fracture: Secondary | ICD-10-CM | POA: Insufficient documentation

## 2014-05-21 DIAGNOSIS — N189 Chronic kidney disease, unspecified: Secondary | ICD-10-CM | POA: Diagnosis not present

## 2014-05-21 DIAGNOSIS — Z792 Long term (current) use of antibiotics: Secondary | ICD-10-CM | POA: Insufficient documentation

## 2014-05-21 DIAGNOSIS — I159 Secondary hypertension, unspecified: Secondary | ICD-10-CM | POA: Diagnosis not present

## 2014-05-21 NOTE — Discharge Instructions (Signed)
Apply triple antibiotic ointment to the laceration twice a day. Follow-up with primary care doctor for recheck of her blood pressure next week. Return if any issues.  Facial Laceration  A facial laceration is a cut on the face. These injuries can be painful and cause bleeding. Lacerations usually heal quickly, but they need special care to reduce scarring. DIAGNOSIS  Your health care provider will take a medical history, ask for details about how the injury occurred, and examine the wound to determine how deep the cut is. TREATMENT  Some facial lacerations may not require closure. Others may not be able to be closed because of an increased risk of infection. The risk of infection and the chance for successful closure will depend on various factors, including the amount of time since the injury occurred. The wound may be cleaned to help prevent infection. If closure is appropriate, pain medicines may be given if needed. Your health care provider will use stitches (sutures), wound glue (adhesive), or skin adhesive strips to repair the laceration. These tools bring the skin edges together to allow for faster healing and a better cosmetic outcome. If needed, you may also be given a tetanus shot. HOME CARE INSTRUCTIONS  Only take over-the-counter or prescription medicines as directed by your health care provider.  Follow your health care provider's instructions for wound care. These instructions will vary depending on the technique used for closing the wound. For Sutures:  Keep the wound clean and dry.   If you were given a bandage (dressing), you should change it at least once a day. Also change the dressing if it becomes wet or dirty, or as directed by your health care provider.   Wash the wound with soap and water 2 times a day. Rinse the wound off with water to remove all soap. Pat the wound dry with a clean towel.   After cleaning, apply a thin layer of the antibiotic ointment recommended by  your health care provider. This will help prevent infection and keep the dressing from sticking.   You may shower as usual after the first 24 hours. Do not soak the wound in water until the sutures are removed.   Get your sutures removed as directed by your health care provider. With facial lacerations, sutures should usually be taken out after 4-5 days to avoid stitch marks.   Wait a few days after your sutures are removed before applying any makeup. For Skin Adhesive Strips:  Keep the wound clean and dry.   Do not get the skin adhesive strips wet. You may bathe carefully, using caution to keep the wound dry.   If the wound gets wet, pat it dry with a clean towel.   Skin adhesive strips will fall off on their own. You may trim the strips as the wound heals. Do not remove skin adhesive strips that are still stuck to the wound. They will fall off in time.  For Wound Adhesive:  You may briefly wet your wound in the shower or bath. Do not soak or scrub the wound. Do not swim. Avoid periods of heavy sweating until the skin adhesive has fallen off on its own. After showering or bathing, gently pat the wound dry with a clean towel.   Do not apply liquid medicine, cream medicine, ointment medicine, or makeup to your wound while the skin adhesive is in place. This may loosen the film before your wound is healed.   If a dressing is placed over the wound,  be careful not to apply tape directly over the skin adhesive. This may cause the adhesive to be pulled off before the wound is healed.   Avoid prolonged exposure to sunlight or tanning lamps while the skin adhesive is in place.  The skin adhesive will usually remain in place for 5-10 days, then naturally fall off the skin. Do not pick at the adhesive film.  After Healing: Once the wound has healed, cover the wound with sunscreen during the day for 1 full year. This can help minimize scarring. Exposure to ultraviolet light in the first  year will darken the scar. It can take 1-2 years for the scar to lose its redness and to heal completely.  SEEK IMMEDIATE MEDICAL CARE IF:  You have redness, pain, or swelling around the wound.   You see ayellowish-white fluid (pus) coming from the wound.   You have chills or a fever.  MAKE SURE YOU:  Understand these instructions.  Will watch your condition.  Will get help right away if you are not doing well or get worse. Document Released: 02/16/2004 Document Revised: 10/29/2012 Document Reviewed: 08/21/2012 [facility] Patient Information 2015 [place], [place]. This information is not intended to replace advice given to you by your health care provider. Make sure you discuss any questions you have with your health care provider.

## 2014-05-21 NOTE — ED Notes (Signed)
Stitches removed by PA.

## 2014-05-21 NOTE — ED Notes (Signed)
Here for suture removal

## 2014-05-21 NOTE — ED Provider Notes (Signed)
CSN: [id number]     Arrival date & time 05/21/14  1258 History   First MD Initiated Contact with Patient 05/21/14 1318     Chief Complaint  Patient presents with  . Suture / Staple Removal     (Consider location/radiation/quality/duration/timing/severity/associated sxs/prior Treatment) HPI Mark Velasquez is a 81 y.o. male with hx of htn, CKD, presents to ED for suture removal. Pt fell 8 days ago after a "dizzy spell." States had to get stitches to a laceration on top of his nose. Healing well with no signs of infection or pain. He has been washing it with water. He has no other complaints.  Past Medical History  Diagnosis Date  . Hypertension   . Dyslipidemia   . Chronic kidney disease     renal failure  . Gout   . BPH (benign prostatic hyperplasia)   . Prostate cancer   . Hip fracture    Past Surgical History  Procedure Laterality Date  . Orif hip fracture      right  . Vertebroplasty    . Appendectomy    . Tonsillectomy    . Prostatectomy    . Cystoscopy      bladder neck contracture   Family History  Problem Relation Age of Onset  . Hypertension Mother   . Pneumonia Mother   . Stroke Father   . Parkinsonism Father    History  Substance Use Topics  . Smoking status: Never Smoker   . Smokeless tobacco: Not on file  . Alcohol Use: No    Review of Systems  Skin: Positive for wound.  Neurological: Negative for dizziness, weakness and headaches.      Allergies  Review of patient's allergies indicates no known allergies.  Home Medications   Prior to Admission medications   Medication Sig Start Date End Date Taking? Authorizing Provider  acebutolol (SECTRAL) 200 MG capsule 1 by mouth twice a day 04/01/14   Dorena Cookey, MD  allopurinol (ZYLOPRIM) 300 MG tablet Take 1 tablet (300 mg total) by mouth daily. 04/01/14   Dorena Cookey, MD  aspirin 81 MG tablet Take 81 mg by mouth daily.      Historical Provider, MD  beta carotene 10000 UNIT capsule Take 10,000  Units by mouth daily.    Historical Provider, MD  Calcium Carbonate-Vitamin D 500-50 MG-UNIT CAPS Take 500 tablets by mouth daily.      Historical Provider, MD  cephALEXin (KEFLEX) 500 MG capsule Take 1 capsule (500 mg total) by mouth 3 (three) times daily. 05/13/14   Evelina Bucy, MD  docusate sodium (COLACE) 100 MG capsule Take 100 mg by mouth. As needed     Historical Provider, MD  ferrous sulfate 325 (65 FE) MG tablet Take 325 mg by mouth daily with breakfast.    Historical Provider, MD  fish oil-omega-3 fatty acids 1000 MG capsule Take 1 g by mouth daily.     Historical Provider, MD  furosemide (LASIX) 40 MG tablet Take 1 tablet (40 mg total) by mouth daily. Patient not taking: Reported on 04/01/2014 02/05/13   Dorena Cookey, MD  lisinopril (PRINIVIL,ZESTRIL) 20 MG tablet Take 1 tablet (20 mg total) by mouth daily. 04/01/14   Dorena Cookey, MD  Lutein 20 MG CAPS Take 1 capsule by mouth daily.      Historical Provider, MD  methocarbamol (ROBAXIN) 500 MG tablet Take 500 mg by mouth 4 (four) times daily.    Historical Provider, MD  metoprolol succinate (  TOPROL-XL) 50 MG 24 hr tablet Take 1 tablet (50 mg total) by mouth daily. Take with or immediately following a meal. 04/01/14   Dorena Cookey, MD  mirabegron ER (MYRBETRIQ) 25 MG TB24 tablet Take 1 tablet (25 mg total) by mouth daily. 04/01/14   Dorena Cookey, MD  Multiple Vitamins-Minerals (ICAPS) CAPS Take by mouth as directed.      Historical Provider, MD  niacin 500 MG tablet Take 500 mg by mouth daily with breakfast.      Historical Provider, MD  Propylene Glycol 0.95 % SOLN Apply to eye.    Historical Provider, MD  tamsulosin (FLOMAX) 0.4 MG CAPS capsule TAKE ONE CAPSULE BY MOUTH EVERY DAY 04/01/14   Dorena Cookey, MD  traMADol Veatrice Bourbon) 50 MG tablet 1 by mouth each bedtime when necessary for pain 04/01/14   Dorena Cookey, MD   BP 174/68 mmHg  Pulse 65  Temp(Src) 97.5 F (36.4 C) (Oral)  Resp 18  Ht 5\' 9"  (1.753 m)  Wt 162 lb (73.483 kg)   BMI 23.91 kg/m2  SpO2 99% Physical Exam  Constitutional: He appears well-developed and well-nourished. No distress.  Cardiovascular: Normal rate, regular rhythm and normal heart sounds.   Pulmonary/Chest: Effort normal and breath sounds normal. No respiratory distress. He has no wheezes. He has no rales.  Skin:  Healing laceration to the bridge of the nose, measuring approximately 4 cm. 7 stitches intact. Minimal scabbing to the laceration. No erythema, no tenderness, no drainage, no dehiscence.  Nursing note and vitals reviewed.   ED Course  Procedures (including critical care time) Labs Review Labs Reviewed - No data to display  Imaging Review No results found.   EKG Interpretation None      SUTURE REMOVAL Performed by: Jeannett Senior A Consent: Verbal consent obtained. Patient identity confirmed: provided demographic data Time out: Immediately prior to procedure a "time out" was called to verify the correct patient, procedure, equipment, support staff and site/side marked as required. Location: nose Wound Appearance: clean Sutures/Staples Removed: 7 Patient tolerance: Patient tolerated the procedure well with no immediate complications.    MDM   Final diagnoses:  Visit for suture removal  Secondary hypertension, unspecified     sutures removed. He has no other complaints. Patient is hypertensive, instructed to make sure and follow up with his doctor for recheck. Patient voiced understanding.  Filed Vitals:   05/21/14 1325  BP: 174/68  Pulse: 65  Temp: 97.5 F (36.4 C)  TempSrc: Oral  Resp: 18  Height: 5\' 9"  (1.753 m)  Weight: 162 lb (73.483 kg)  SpO2: 99%     Jeannett Senior, PA-C 05/21/14 1422  Carmin Muskrat, MD 05/21/14 1528

## 2014-06-01 ENCOUNTER — Ambulatory Visit (INDEPENDENT_AMBULATORY_CARE_PROVIDER_SITE_OTHER): Payer: Medicare Other | Admitting: Family Medicine

## 2014-06-01 ENCOUNTER — Encounter: Payer: Self-pay | Admitting: Family Medicine

## 2014-06-01 VITALS — BP 114/78 | Temp 97.6°F | Wt 164.0 lb

## 2014-06-01 DIAGNOSIS — I10 Essential (primary) hypertension: Secondary | ICD-10-CM | POA: Diagnosis not present

## 2014-06-01 DIAGNOSIS — T671XXD Heat syncope, subsequent encounter: Secondary | ICD-10-CM | POA: Diagnosis not present

## 2014-06-01 DIAGNOSIS — R55 Syncope and collapse: Secondary | ICD-10-CM | POA: Insufficient documentation

## 2014-06-01 NOTE — Progress Notes (Signed)
   Subjective:    Patient ID: Mark Velasquez, male    DOB: 10-04-1916, 81 y.o.   MRN: [id number]  HPI Mark Velasquez is a 81 year old married........ for the third time I believe..... Male nonsmoker who comes in today for evaluation of multiple issues  He has chronic venous insufficiency and takes Lasix 40 mg when necessary. Despite that his legs still swell although he's wearing calf high stockings.  His blood pressure at home is running 875-643 systolic diastolics 32-95.  He says he is concerned about Parkinson's disease cassette 3 episodes in the last 7 years where he got lightheaded and fell and cut himself. He has no baseline tremor the sound more like syncopal episodes to me   Review of Systems    review of systems otherwise negative Objective:   Physical Exam  Well-developed well-nourished male no acute distress vital signs stable is afebrile BP 120/80 right arm sitting position examination lower extremity shows trace edema to 1+ edema both legs      Assessment & Plan:  Hypertension....... at goal................ continue current therapy  Venous insufficiency.......Marland Kitchen Lasix 40 mg daily Monday Wednesday Friday  Thigh-high stockings  History of syncope advised in the future when he feels that way to go to ground.

## 2014-06-01 NOTE — Patient Instructions (Signed)
Lasix 40 mg........Marland Kitchen 1 Monday Wednesday Friday  Go to [facility] to look at the thigh-high stockings  Check your blood pressure weekly since its normal  Continue your other medications  Return in November for your annual physical exam with [employer]...........Marland Kitchen our new adult nurse practitioner

## 2014-06-01 NOTE — Progress Notes (Signed)
Pre visit review using our clinic review tool, if applicable. No additional management support is needed unless otherwise documented below in the visit note. 

## 2014-06-08 DIAGNOSIS — H35363 Drusen (degenerative) of macula, bilateral: Secondary | ICD-10-CM | POA: Diagnosis not present

## 2014-08-30 DIAGNOSIS — N3941 Urge incontinence: Secondary | ICD-10-CM | POA: Diagnosis not present

## 2014-08-30 DIAGNOSIS — R3912 Poor urinary stream: Secondary | ICD-10-CM | POA: Diagnosis not present

## 2014-09-18 DIAGNOSIS — I10 Essential (primary) hypertension: Secondary | ICD-10-CM | POA: Diagnosis not present

## 2014-09-18 DIAGNOSIS — Z79899 Other long term (current) drug therapy: Secondary | ICD-10-CM | POA: Diagnosis not present

## 2014-09-18 DIAGNOSIS — N390 Urinary tract infection, site not specified: Secondary | ICD-10-CM | POA: Diagnosis not present

## 2014-09-18 DIAGNOSIS — S098XXA Other specified injuries of head, initial encounter: Secondary | ICD-10-CM | POA: Diagnosis not present

## 2014-09-18 DIAGNOSIS — S0101XA Laceration without foreign body of scalp, initial encounter: Secondary | ICD-10-CM | POA: Diagnosis not present

## 2014-09-18 DIAGNOSIS — I493 Ventricular premature depolarization: Secondary | ICD-10-CM | POA: Diagnosis not present

## 2014-09-18 DIAGNOSIS — S0190XA Unspecified open wound of unspecified part of head, initial encounter: Secondary | ICD-10-CM | POA: Diagnosis not present

## 2014-09-18 DIAGNOSIS — S0990XA Unspecified injury of head, initial encounter: Secondary | ICD-10-CM | POA: Diagnosis not present

## 2014-09-18 DIAGNOSIS — W1839XA Other fall on same level, initial encounter: Secondary | ICD-10-CM | POA: Diagnosis not present

## 2014-09-18 DIAGNOSIS — Z7982 Long term (current) use of aspirin: Secondary | ICD-10-CM | POA: Diagnosis not present

## 2014-09-21 ENCOUNTER — Telehealth: Payer: Self-pay

## 2014-09-21 NOTE — Telephone Encounter (Signed)
Appointment made

## 2014-09-21 NOTE — Telephone Encounter (Signed)
The patient's wife called and is hoping to have the patient worked in for an appointment next week.  She stated the patient fell and is needing his stiches removed next week.  She stated the stiches were placed at [facility] in [place], however, the wife is stating he can not go back to the clinic to have them removed.

## 2014-09-29 ENCOUNTER — Encounter: Payer: Self-pay | Admitting: Family Medicine

## 2014-09-29 ENCOUNTER — Ambulatory Visit (INDEPENDENT_AMBULATORY_CARE_PROVIDER_SITE_OTHER): Payer: Medicare Other | Admitting: Family Medicine

## 2014-09-29 VITALS — BP 140/80 | Temp 97.7°F | Wt 160.0 lb

## 2014-09-29 DIAGNOSIS — Z4802 Encounter for removal of sutures: Secondary | ICD-10-CM

## 2014-09-29 DIAGNOSIS — Z23 Encounter for immunization: Secondary | ICD-10-CM | POA: Diagnosis not present

## 2014-09-29 NOTE — Progress Notes (Signed)
Pre visit review using our clinic review tool, if applicable. No additional management support is needed unless otherwise documented below in the visit note. 

## 2014-09-29 NOTE — Patient Instructions (Signed)
Decrease the Sectral 200 mg......... take only one tab daily  Check your blood pressure daily in the morning  Return in one month for follow-up to see me  Call urologist today to see if the medication they've given you could be contributing to the syncopal episodes ..........Marland Kitchen

## 2014-09-29 NOTE — Progress Notes (Signed)
   Subjective:    Patient ID: Mark Velasquez, male    DOB: January 03, 1917, 81 y.o.   MRN: [id number]  HPI  Mark Velasquez is a 81 year old male who comes in today company by his son for suture removal. Had a syncopal episode about a week ago fell sustaining a laceration posterior scalp. Went the emergency room that put in 7 staples. This is a second syncopal episode is had in the past month. His urologist is giving him the medication for urinary incontinence if he wonders if this could be part of the problem. It's a new medication him not for male with. I therefore asked him to call his urologist. He is on a beta blocker for hypertension I think we can let his blood pressure come up a little bit by decreasing the dose to 200 daily  Review of Systems Review of systems otherwise negative    Objective:   Physical Exam Motor up well-nourished male no acute distress vital signs stable he is afebrile BP 140/80 pulse 70 and regular  The staples removed without complications       Assessment & Plan:  Syncopal episode with laceration of scalp..... No sequelae..... Staples removed  Episodes of postural hypotension......... cut the beta blocker in half  Urinary incontinence....... follow-up with urology to see if the medication could be contributing to his syncope

## 2014-10-27 ENCOUNTER — Encounter: Payer: Self-pay | Admitting: Family Medicine

## 2014-10-27 ENCOUNTER — Ambulatory Visit (INDEPENDENT_AMBULATORY_CARE_PROVIDER_SITE_OTHER): Payer: Medicare Other | Admitting: Family Medicine

## 2014-10-27 VITALS — BP 130/80 | HR 74 | Temp 97.3°F | Wt 162.2 lb

## 2014-10-27 DIAGNOSIS — I10 Essential (primary) hypertension: Secondary | ICD-10-CM

## 2014-10-27 MED ORDER — FUROSEMIDE 20 MG PO TABS: 20.0000 mg | ORAL_TABLET | Freq: Every day | ORAL | Status: DC

## 2014-10-27 NOTE — Progress Notes (Signed)
   Subjective:    Patient ID: Mark Velasquez, male    DOB: 1916/09/21, 81 y.o.   MRN: [id number]  HPI [name] is a 81 year old male who comes in today for reevaluation of hypertension  He had a syncopal episode with severe trauma the required staples. At that time his blood pressure was in the 110/70 range. We cut his Sectral back from 200 twice a day to 200 daily his blood pressure is 130/80. But is not lightheaded when he stands up. He's also complaining of a dry mouth with the 40 mg of Lasix.   Review of Systems Review of systems otherwise negative    Objective:   Physical Exam Well-developed well-nourished male no acute distress vital signs stable he is afebrile BP 130/80 pulse 70 and regular       Assessment & Plan:  Hypertension.......... BP tells to low for his age...........Marland Kitchen would like to see his BP 140/90 range...Marland KitchenMarland KitchenMarland Kitchen stop the Sectral completely and decrease Lasix to 20 mg daily. Follow-up in 4 weeks

## 2014-10-27 NOTE — Progress Notes (Signed)
Pre visit review using our clinic review tool, if applicable. No additional management support is needed unless otherwise documented below in the visit note. 

## 2014-10-27 NOTE — Patient Instructions (Signed)
Stop the Sectral  Decrease the Lasix from 40 mg a day to 20  Follow-up in 4 weeks

## 2014-12-07 ENCOUNTER — Encounter: Payer: Self-pay | Admitting: Family Medicine

## 2014-12-07 ENCOUNTER — Ambulatory Visit (INDEPENDENT_AMBULATORY_CARE_PROVIDER_SITE_OTHER): Payer: Medicare Other | Admitting: Family Medicine

## 2014-12-07 VITALS — BP 160/70 | HR 70 | Temp 98.2°F | Wt 161.0 lb

## 2014-12-07 DIAGNOSIS — I10 Essential (primary) hypertension: Secondary | ICD-10-CM

## 2014-12-07 NOTE — Progress Notes (Signed)
   Subjective:    Patient ID: FEDERICO SCHARRER, male    DOB: 1916-05-14, 81 y.o.   MRN: [id number]  HPI Antonyo is a 81 year old male who comes in today for follow-up of hypertension  We been gradually decreasing his blood pressure medication because he had 3 episodes of hypotension with severe trauma. 42 he did not require break any major bones. But he did have some significant facial trauma and lacerations. We've stopped the Celexa all completely. He's now on Lasix 20 mg daily when necessary, lisinopril 20 mg daily, Toprol 50 mg daily  BP today 160/70 pulse is up to 70. On the Sectral is pulses of drop down the 60s. He said no more episodes of lightheadedness or syncope. He does get lightheaded if he stands up quickly. But no more syncope.  Review of Systems Review of systems negative    Objective:   Physical Exam  Well-developed well-nourished male no acute distress vital signs stable he is afebrile BP sitting position right arm 160/70 pulse 70 and regular      Assessment & Plan:  Hypertension at goal for age..... Continue current therapy follow-up as needed

## 2014-12-07 NOTE — Progress Notes (Signed)
Pre visit review using our clinic review tool, if applicable. No additional management support is needed unless otherwise documented below in the visit note. 

## 2014-12-07 NOTE — Patient Instructions (Signed)
Continue current medications  When you're due for your next physical examination set up a time with Tommi Rumps or Almyra Free.......... are 2 new adult nurse practitioner's. They would do year physical examination and that you will establish with them for long-term care

## 2014-12-28 DIAGNOSIS — H353130 Nonexudative age-related macular degeneration, bilateral, stage unspecified: Secondary | ICD-10-CM | POA: Diagnosis not present

## 2014-12-28 DIAGNOSIS — H43813 Vitreous degeneration, bilateral: Secondary | ICD-10-CM | POA: Diagnosis not present

## 2014-12-28 DIAGNOSIS — Z961 Presence of intraocular lens: Secondary | ICD-10-CM | POA: Diagnosis not present

## 2014-12-28 DIAGNOSIS — H35371 Puckering of macula, right eye: Secondary | ICD-10-CM | POA: Diagnosis not present

## 2015-02-07 ENCOUNTER — Encounter: Payer: Self-pay | Admitting: Adult Health

## 2015-02-07 ENCOUNTER — Ambulatory Visit (INDEPENDENT_AMBULATORY_CARE_PROVIDER_SITE_OTHER): Payer: Medicare Other | Admitting: Adult Health

## 2015-02-07 VITALS — BP 120/70 | Temp 97.5°F | Ht 69.0 in | Wt 156.9 lb

## 2015-02-07 DIAGNOSIS — Z7189 Other specified counseling: Secondary | ICD-10-CM

## 2015-02-07 DIAGNOSIS — I10 Essential (primary) hypertension: Secondary | ICD-10-CM

## 2015-02-07 DIAGNOSIS — Z7689 Persons encountering health services in other specified circumstances: Secondary | ICD-10-CM

## 2015-02-07 NOTE — Progress Notes (Signed)
HPI:  Mark Velasquez is here to establish care. He is a pleasant and active 82 year old caucasian male who  has a past medical history of Hypertension; Dyslipidemia; Chronic kidney disease; Gout; BPH (benign prostatic hyperplasia); Prostate cancer ([place]) (1999); Femur fracture ([place]); and Hearing loss.  Last PCP and physical: Two years ago with MD Sherren Mocha  Immunizations:UTD Diet: Does not follow a specific diet Exercise: Does not exercise on a regular basis.  Colonoscopy: Is not needed anymore  Has the following chronic problems that require follow up and concerns today:  Hypertension - Well controlled on current medication.   Syncope - He does endorse that he has occasional episodes of syncope. These episodes have resulted in sutures and staples to head. Per patient, " Sometimes, I just fall over sometimes and my head goes first." He has no suffered any broken bones from the falls. Has a history of Meniere's disease. Last instance was about 4 months ago.   ROS negative for unless reported above: fevers, chills,feeling poorly, unintentional weight loss, hearing or vision loss, chest pain, palpitations, leg claudication, struggling to breath,Not feeling congested in the chest, no orthopenia, no cough,no wheezing, normal appetite, no soft tissue swelling, no hemoptysis, melena, hematochezia, hematuria, falls, loc, si, or thoughts of self harm.   Past Medical History  Diagnosis Date  . Hypertension   . Dyslipidemia   . Chronic kidney disease     renal failure  . Gout   . BPH (benign prostatic hyperplasia)   . Prostate cancer ([place])   . Hip fracture [facility])     Past Surgical History  Procedure Laterality Date  . Orif hip fracture      right  . Vertebroplasty    . Appendectomy    . Tonsillectomy    . Prostatectomy    . Cystoscopy      bladder neck contracture    Family History  Problem Relation Age of Onset  . Hypertension Mother   . Pneumonia Mother   . Stroke Father   .  Parkinsonism Father     Social History   Social History  . Marital Status: Married    Spouse Name: N/A  . Number of Children: N/A  . Years of Education: N/A   Social History Main Topics  . Smoking status: Never Smoker   . Smokeless tobacco: None  . Alcohol Use: No  . Drug Use: No  . Sexual Activity: Not Asked     Comment: regular exercise - yes   Other Topics Concern  . None   Social History Narrative     Current outpatient prescriptions:  .  acebutolol (SECTRAL) 200 MG capsule, , Disp: , Rfl:  .  allopurinol (ZYLOPRIM) 300 MG tablet, Take 1 tablet (300 mg total) by mouth daily., Disp: 100 tablet, Rfl: 3 .  aspirin 81 MG tablet, Take 81 mg by mouth daily.  , Disp: , Rfl:  .  beta carotene 10000 UNIT capsule, Take 10,000 Units by mouth daily., Disp: , Rfl:  .  Calcium Carbonate-Vitamin D 500-50 MG-UNIT CAPS, Take 500 tablets by mouth daily.  , Disp: , Rfl:  .  docusate sodium (COLACE) 100 MG capsule, Take 100 mg by mouth. As needed , Disp: , Rfl:  .  ferrous sulfate 325 (65 FE) MG tablet, Take 325 mg by mouth daily with breakfast., Disp: , Rfl:  .  fish oil-omega-3 fatty acids 1000 MG capsule, Take 1 g by mouth daily. , Disp: , Rfl:  .  furosemide (LASIX) 20 MG tablet, Take 1 tablet (20 mg total) by mouth daily., Disp: 100 tablet, Rfl: 3 .  lisinopril (PRINIVIL,ZESTRIL) 20 MG tablet, Take 1 tablet (20 mg total) by mouth daily., Disp: 90 tablet, Rfl: 3 .  Lutein 20 MG CAPS, Take 1 capsule by mouth daily.  , Disp: , Rfl:  .  methocarbamol (ROBAXIN) 500 MG tablet, Take 500 mg by mouth 4 (four) times daily., Disp: , Rfl:  .  metoprolol succinate (TOPROL-XL) 50 MG 24 hr tablet, Take 1 tablet (50 mg total) by mouth daily. Take with or immediately following a meal., Disp: 90 tablet, Rfl: 3 .  mirabegron ER (MYRBETRIQ) 50 MG TB24 tablet, Take 50 mg by mouth daily., Disp: , Rfl:  .  Multiple Vitamins-Minerals (ICAPS) CAPS, Take by mouth as directed.  , Disp: , Rfl:  .  niacin 500 MG  tablet, Take 500 mg by mouth daily with breakfast.  , Disp: , Rfl:  .  Propylene Glycol 0.95 % SOLN, Apply to eye., Disp: , Rfl:  .  tamsulosin (FLOMAX) 0.4 MG CAPS capsule, TAKE ONE CAPSULE BY MOUTH EVERY DAY, Disp: 100 capsule, Rfl: 3 .  traMADol (ULTRAM) 50 MG tablet, 1 by mouth each bedtime when necessary for pain, Disp: 60 tablet, Rfl: 5  EXAM:  Filed Vitals:   02/07/15 0955  BP: 120/70  Temp: 97.5 F (36.4 C)    Body mass index is 23.16 kg/(m^2).  GENERAL: vitals reviewed and listed above, alert, oriented, appears well hydrated and in no acute distress  HEENT: atraumatic, conjunttiva clear, no obvious abnormalities on inspection of external nose and ears  NECK: Neck is soft and supple without masses, no adenopathy or thyromegaly, trachea midline, no JVD. Normal range of motion.   LUNGS: clear to auscultation bilaterally, no wheezes, rales or rhonchi, good air movement  CV: Regular rate and rhythm, normal S1/S2, no audible murmurs, gallops, or rubs. No carotid bruit. Trace non pitting bilateral lower extremity edema.   MS: moves all extremities without noticeable abnormality. No edema noted. Uses a cane  Abd: soft/nontender/nondistended/normal bowel sounds   Skin: warm and dry, no rash   Extremities: No clubbing, cyanosis, or edema. Capillary refill is WNL. Pulses intact bilaterally in upper and lower extremities.   Neuro: CN II-XII intact, sensation and reflexes normal throughout, 5/5 muscle strength in bilateral upper and lower extremities. Normal finger to nose. Normal rapid alternating movements. Normal romberg. No pronator drift.   PSYCH: pleasant and cooperative, no obvious depression or anxiety  ASSESSMENT AND PLAN:  1. Essential hypertension - No change in medication  2. Encounter to establish care - Follow up in 1-2 months for MWE - Follow up sooner if needed - Advised to continue to be as active as possible.    -We reviewed the PMH, PSH, FH, SH, Meds  and Allergies. -We provided refills for any medications we will prescribe as needed. -We addressed current concerns per orders and patient instructions. -We have asked for records for pertinent exams, studies, vaccines and notes from previous providers. -We have advised patient to follow up per instructions below.   -Patient advised to return or notify a provider immediately if symptoms worsen or persist or new concerns arise.   Dorothyann Peng, AGNP

## 2015-02-07 NOTE — Progress Notes (Signed)
Pre visit review using our clinic review tool, if applicable. No additional management support is needed unless otherwise documented below in the visit note. 

## 2015-02-07 NOTE — Patient Instructions (Signed)
It was great meeting you today!  Please follow up for a physical. If you need anything in the meantime, please let me know.

## 2015-03-11 ENCOUNTER — Other Ambulatory Visit: Payer: Self-pay | Admitting: Family Medicine

## 2015-04-18 ENCOUNTER — Ambulatory Visit (INDEPENDENT_AMBULATORY_CARE_PROVIDER_SITE_OTHER): Payer: Medicare Other | Admitting: Family Medicine

## 2015-04-18 ENCOUNTER — Encounter: Payer: Self-pay | Admitting: Family Medicine

## 2015-04-18 VITALS — BP 130/80 | Temp 97.5°F | Wt 157.0 lb

## 2015-04-18 DIAGNOSIS — I10 Essential (primary) hypertension: Secondary | ICD-10-CM

## 2015-04-18 NOTE — Progress Notes (Signed)
Pre visit review using our clinic review tool, if applicable. No additional management support is needed unless otherwise documented below in the visit note. 

## 2015-04-18 NOTE — Patient Instructions (Signed)
Stop taking the Sectral  Continue the Lasix 20 mg once daily in the morning, lisinopril 20 mg 1 daily in the morning, and Toprol 50 mg 1 tablet daily in the morning  Follow-up in 4-6 weeks

## 2015-04-18 NOTE — Progress Notes (Signed)
   Subjective:    Patient ID: Mark Velasquez, male    DOB: 1916/09/01, 82 y.o.   MRN: [id number]  HPI Mark Velasquez is a 82 year old male nonsmoker who comes in today for follow-up of hypertension  He's been on a combination of medications for his blood pressure. Her recently developed hypotension. We been tapering his blood pressure slowly. BP today is 130/80 any still having episodes where he gets lightheaded when he stands up. No more syncope   Review of Systems    review of systems negative Objective:   Physical Exam  Well-developed well-nourished male no acute distress vital signs stable he is afebrile BP right arm sitting position 130/80      Assessment & Plan:  Hypertension,,,,,,,,, I would prefer his blood pressure daily in the 140/90 range,,,,,,,, therefore we will stop theSectral,,,,,,,,, BP check daily follow-up in one month

## 2015-05-04 DIAGNOSIS — M25551 Pain in right hip: Secondary | ICD-10-CM | POA: Diagnosis not present

## 2015-05-04 DIAGNOSIS — M9905 Segmental and somatic dysfunction of pelvic region: Secondary | ICD-10-CM | POA: Diagnosis not present

## 2015-05-04 DIAGNOSIS — M9903 Segmental and somatic dysfunction of lumbar region: Secondary | ICD-10-CM | POA: Diagnosis not present

## 2015-05-04 DIAGNOSIS — M5136 Other intervertebral disc degeneration, lumbar region: Secondary | ICD-10-CM | POA: Diagnosis not present

## 2015-05-04 DIAGNOSIS — M9904 Segmental and somatic dysfunction of sacral region: Secondary | ICD-10-CM | POA: Diagnosis not present

## 2015-05-07 ENCOUNTER — Other Ambulatory Visit: Payer: Self-pay | Admitting: Family Medicine

## 2015-05-09 DIAGNOSIS — M5136 Other intervertebral disc degeneration, lumbar region: Secondary | ICD-10-CM | POA: Diagnosis not present

## 2015-05-09 DIAGNOSIS — M9904 Segmental and somatic dysfunction of sacral region: Secondary | ICD-10-CM | POA: Diagnosis not present

## 2015-05-09 DIAGNOSIS — M9905 Segmental and somatic dysfunction of pelvic region: Secondary | ICD-10-CM | POA: Diagnosis not present

## 2015-05-09 DIAGNOSIS — M25551 Pain in right hip: Secondary | ICD-10-CM | POA: Diagnosis not present

## 2015-05-09 DIAGNOSIS — M9903 Segmental and somatic dysfunction of lumbar region: Secondary | ICD-10-CM | POA: Diagnosis not present

## 2015-05-10 DIAGNOSIS — M25551 Pain in right hip: Secondary | ICD-10-CM | POA: Diagnosis not present

## 2015-05-10 DIAGNOSIS — M9904 Segmental and somatic dysfunction of sacral region: Secondary | ICD-10-CM | POA: Diagnosis not present

## 2015-05-10 DIAGNOSIS — M9903 Segmental and somatic dysfunction of lumbar region: Secondary | ICD-10-CM | POA: Diagnosis not present

## 2015-05-10 DIAGNOSIS — M9905 Segmental and somatic dysfunction of pelvic region: Secondary | ICD-10-CM | POA: Diagnosis not present

## 2015-05-10 DIAGNOSIS — M5136 Other intervertebral disc degeneration, lumbar region: Secondary | ICD-10-CM | POA: Diagnosis not present

## 2015-05-12 DIAGNOSIS — M9905 Segmental and somatic dysfunction of pelvic region: Secondary | ICD-10-CM | POA: Diagnosis not present

## 2015-05-12 DIAGNOSIS — M9903 Segmental and somatic dysfunction of lumbar region: Secondary | ICD-10-CM | POA: Diagnosis not present

## 2015-05-12 DIAGNOSIS — M5136 Other intervertebral disc degeneration, lumbar region: Secondary | ICD-10-CM | POA: Diagnosis not present

## 2015-05-12 DIAGNOSIS — M9904 Segmental and somatic dysfunction of sacral region: Secondary | ICD-10-CM | POA: Diagnosis not present

## 2015-05-12 DIAGNOSIS — M25551 Pain in right hip: Secondary | ICD-10-CM | POA: Diagnosis not present

## 2015-05-16 DIAGNOSIS — M25551 Pain in right hip: Secondary | ICD-10-CM | POA: Diagnosis not present

## 2015-05-16 DIAGNOSIS — M5136 Other intervertebral disc degeneration, lumbar region: Secondary | ICD-10-CM | POA: Diagnosis not present

## 2015-05-16 DIAGNOSIS — M9904 Segmental and somatic dysfunction of sacral region: Secondary | ICD-10-CM | POA: Diagnosis not present

## 2015-05-16 DIAGNOSIS — M9903 Segmental and somatic dysfunction of lumbar region: Secondary | ICD-10-CM | POA: Diagnosis not present

## 2015-05-16 DIAGNOSIS — M9905 Segmental and somatic dysfunction of pelvic region: Secondary | ICD-10-CM | POA: Diagnosis not present

## 2015-05-17 DIAGNOSIS — M9905 Segmental and somatic dysfunction of pelvic region: Secondary | ICD-10-CM | POA: Diagnosis not present

## 2015-05-17 DIAGNOSIS — M9904 Segmental and somatic dysfunction of sacral region: Secondary | ICD-10-CM | POA: Diagnosis not present

## 2015-05-17 DIAGNOSIS — M5136 Other intervertebral disc degeneration, lumbar region: Secondary | ICD-10-CM | POA: Diagnosis not present

## 2015-05-17 DIAGNOSIS — M9903 Segmental and somatic dysfunction of lumbar region: Secondary | ICD-10-CM | POA: Diagnosis not present

## 2015-05-17 DIAGNOSIS — M25551 Pain in right hip: Secondary | ICD-10-CM | POA: Diagnosis not present

## 2015-05-19 DIAGNOSIS — M9903 Segmental and somatic dysfunction of lumbar region: Secondary | ICD-10-CM | POA: Diagnosis not present

## 2015-05-19 DIAGNOSIS — M9904 Segmental and somatic dysfunction of sacral region: Secondary | ICD-10-CM | POA: Diagnosis not present

## 2015-05-19 DIAGNOSIS — M9905 Segmental and somatic dysfunction of pelvic region: Secondary | ICD-10-CM | POA: Diagnosis not present

## 2015-05-19 DIAGNOSIS — M25551 Pain in right hip: Secondary | ICD-10-CM | POA: Diagnosis not present

## 2015-05-19 DIAGNOSIS — M5136 Other intervertebral disc degeneration, lumbar region: Secondary | ICD-10-CM | POA: Diagnosis not present

## 2015-05-23 DIAGNOSIS — M9904 Segmental and somatic dysfunction of sacral region: Secondary | ICD-10-CM | POA: Diagnosis not present

## 2015-05-23 DIAGNOSIS — M9903 Segmental and somatic dysfunction of lumbar region: Secondary | ICD-10-CM | POA: Diagnosis not present

## 2015-05-23 DIAGNOSIS — M25551 Pain in right hip: Secondary | ICD-10-CM | POA: Diagnosis not present

## 2015-05-23 DIAGNOSIS — M9905 Segmental and somatic dysfunction of pelvic region: Secondary | ICD-10-CM | POA: Diagnosis not present

## 2015-05-23 DIAGNOSIS — M5136 Other intervertebral disc degeneration, lumbar region: Secondary | ICD-10-CM | POA: Diagnosis not present

## 2015-05-26 DIAGNOSIS — M25551 Pain in right hip: Secondary | ICD-10-CM | POA: Diagnosis not present

## 2015-05-26 DIAGNOSIS — M5136 Other intervertebral disc degeneration, lumbar region: Secondary | ICD-10-CM | POA: Diagnosis not present

## 2015-05-26 DIAGNOSIS — M9904 Segmental and somatic dysfunction of sacral region: Secondary | ICD-10-CM | POA: Diagnosis not present

## 2015-05-26 DIAGNOSIS — M9905 Segmental and somatic dysfunction of pelvic region: Secondary | ICD-10-CM | POA: Diagnosis not present

## 2015-05-26 DIAGNOSIS — M9903 Segmental and somatic dysfunction of lumbar region: Secondary | ICD-10-CM | POA: Diagnosis not present

## 2015-05-30 DIAGNOSIS — M9903 Segmental and somatic dysfunction of lumbar region: Secondary | ICD-10-CM | POA: Diagnosis not present

## 2015-05-30 DIAGNOSIS — M9905 Segmental and somatic dysfunction of pelvic region: Secondary | ICD-10-CM | POA: Diagnosis not present

## 2015-05-30 DIAGNOSIS — M9904 Segmental and somatic dysfunction of sacral region: Secondary | ICD-10-CM | POA: Diagnosis not present

## 2015-05-30 DIAGNOSIS — M5136 Other intervertebral disc degeneration, lumbar region: Secondary | ICD-10-CM | POA: Diagnosis not present

## 2015-05-30 DIAGNOSIS — M25551 Pain in right hip: Secondary | ICD-10-CM | POA: Diagnosis not present

## 2015-05-31 ENCOUNTER — Ambulatory Visit (INDEPENDENT_AMBULATORY_CARE_PROVIDER_SITE_OTHER): Payer: Medicare Other | Admitting: Adult Health

## 2015-05-31 ENCOUNTER — Encounter: Payer: Self-pay | Admitting: Adult Health

## 2015-05-31 VITALS — BP 170/80 | Wt 159.0 lb

## 2015-05-31 DIAGNOSIS — M5136 Other intervertebral disc degeneration, lumbar region: Secondary | ICD-10-CM | POA: Diagnosis not present

## 2015-05-31 DIAGNOSIS — M25551 Pain in right hip: Secondary | ICD-10-CM | POA: Diagnosis not present

## 2015-05-31 DIAGNOSIS — M9903 Segmental and somatic dysfunction of lumbar region: Secondary | ICD-10-CM | POA: Diagnosis not present

## 2015-05-31 DIAGNOSIS — M9904 Segmental and somatic dysfunction of sacral region: Secondary | ICD-10-CM | POA: Diagnosis not present

## 2015-05-31 DIAGNOSIS — M9905 Segmental and somatic dysfunction of pelvic region: Secondary | ICD-10-CM | POA: Diagnosis not present

## 2015-05-31 DIAGNOSIS — I10 Essential (primary) hypertension: Secondary | ICD-10-CM

## 2015-05-31 MED ORDER — LISINOPRIL 30 MG PO TABS: 30.0000 mg | ORAL_TABLET | Freq: Every day | ORAL | Status: DC

## 2015-05-31 NOTE — Progress Notes (Signed)
Subjective:    Patient ID: Mark Velasquez, male    DOB: 03-Aug-1916, 82 y.o.   MRN: [id number]  HPI Mark Velasquez is a 82 year old male nonsmoker who comes in today for follow-up of hypertension.  He last saw Dr. Sherren Mocha on 04/18/2015 at which point his BP was in the 123XX123 systolic. He continued to have episodes of feeling lightheaded when he stood up. He was told to stop taking the Sectral.   Today in the office he reports that his blood pressure is [id number] systolic. He reports that he is no longer experiencing much lightheadedness.   BP Readings from Last 3 Encounters:  05/31/15 170/80  04/18/15 130/80  02/07/15 120/70      Review of Systems  Constitutional: Negative.   Respiratory: Negative.   Cardiovascular: Negative.   Neurological: Negative.   All other systems reviewed and are negative.  Past Medical History  Diagnosis Date  . Hypertension   . Dyslipidemia   . Chronic kidney disease     renal failure  . Gout   . BPH (benign prostatic hyperplasia)   . Prostate cancer ([place]) 1999  . Femur fracture ([place])   . Hearing loss     Social History   Social History  . Marital Status: Married    Spouse Name: N/A  . Number of Children: N/A  . Years of Education: N/A   Occupational History  . Not on file.   Social History Main Topics  . Smoking status: Never Smoker   . Smokeless tobacco: Not on file  . Alcohol Use: No  . Drug Use: No  . Sexual Activity: Not on file     Comment: regular exercise - yes   Other Topics Concern  . 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       Four children, two in [place], one in [place] and one in [place].           Past Surgical History  Procedure Laterality Date  . Orif hip fracture      right  . Vertebroplasty    . Appendectomy    . Tonsillectomy    . Prostatectomy    . Cystoscopy      bladder  neck contracture    Family History  Problem Relation Age of Onset  . Hypertension Mother   . Pneumonia Mother   . Stroke Father   . Parkinsonism Father     Allergies  Allergen Reactions  . Iodinated Diagnostic Agents Nausea And Vomiting    Current Outpatient Prescriptions on File Prior to Visit  Medication Sig Dispense Refill  . allopurinol (ZYLOPRIM) 300 MG tablet TAKE 1 TABLET DAILY 100 tablet 1  . aspirin 81 MG tablet Take 81 mg by mouth daily.      . beta carotene 10000 UNIT capsule Take 10,000 Units by mouth daily.    . Calcium Carbonate-Vitamin D 500-50 MG-UNIT CAPS Take 500 tablets by mouth daily.      . ferrous sulfate 325 (65 FE) MG tablet Take 325 mg by mouth daily with breakfast.    . fish oil-omega-3 fatty acids 1000 MG capsule Take 1 g by mouth daily.     . furosemide (LASIX) 20 MG tablet Take 1 tablet (20 mg total) by mouth daily. 100 tablet 3  .  Lutein 20 MG CAPS Take 1 capsule by mouth daily.      . methocarbamol (ROBAXIN) 500 MG tablet Take 500 mg by mouth 4 (four) times daily.    . metoprolol succinate (TOPROL-XL) 50 MG 24 hr tablet TAKE 1 TABLET DAILY. TAKE WITH OR IMMEDIATELY FOLLOWING A MEAL 90 tablet 1  . mirabegron ER (MYRBETRIQ) 50 MG TB24 tablet Take 50 mg by mouth daily.    . Multiple Vitamins-Minerals (ICAPS) CAPS Take by mouth as directed.      . niacin 500 MG tablet Take 500 mg by mouth daily with breakfast.      . Propylene Glycol 0.95 % SOLN Apply to eye.    . tamsulosin (FLOMAX) 0.4 MG CAPS capsule TAKE ONE CAPSULE BY MOUTH EVERY DAY 100 capsule 3  . traMADol (ULTRAM) 50 MG tablet 1 by mouth each bedtime when necessary for pain 60 tablet 5   No current facility-administered medications on file prior to visit.    BP 170/80 mmHg  Wt 159 lb (72.122 kg)       Objective:   Physical Exam  Constitutional: He is oriented to person, place, and time. He appears well-developed and well-nourished. No distress.  Cardiovascular: Normal rate, regular  rhythm, normal heart sounds and intact distal pulses.  Exam reveals no gallop and no friction rub.   No murmur heard. Pulmonary/Chest: Effort normal and breath sounds normal. No respiratory distress. He has no wheezes. He has no rales. He exhibits no tenderness.  Neurological: He is alert and oriented to person, place, and time.  Skin: Skin is warm and dry. No rash noted. He is not diaphoretic. No erythema. No pallor.  Psychiatric: He has a normal mood and affect. His behavior is normal. Judgment and thought content normal.  Nursing note and vitals reviewed.      Assessment & Plan:  1. Essential hypertension - Increase lisinopril to 30 mg from 20 mg. I would like his BP around [id number] systolic.  - lisinopril (PRINIVIL,ZESTRIL) 30 MG tablet; Take 1 tablet (30 mg total) by mouth daily.  Dispense: 90 tablet; Refill: 3 - Follow up in three weeks or sooner if needed  Dorothyann Peng, NP

## 2015-05-31 NOTE — Patient Instructions (Signed)
It was great seeing you again.   Your blood pressure is a little to high right now.   I have increased your lisinopril to 30 mg daily. There is a new prescription at the pharmacy.   Follow up with me in three weeks.

## 2015-06-02 DIAGNOSIS — M25551 Pain in right hip: Secondary | ICD-10-CM | POA: Diagnosis not present

## 2015-06-02 DIAGNOSIS — M5136 Other intervertebral disc degeneration, lumbar region: Secondary | ICD-10-CM | POA: Diagnosis not present

## 2015-06-02 DIAGNOSIS — M9903 Segmental and somatic dysfunction of lumbar region: Secondary | ICD-10-CM | POA: Diagnosis not present

## 2015-06-02 DIAGNOSIS — M9905 Segmental and somatic dysfunction of pelvic region: Secondary | ICD-10-CM | POA: Diagnosis not present

## 2015-06-02 DIAGNOSIS — M9904 Segmental and somatic dysfunction of sacral region: Secondary | ICD-10-CM | POA: Diagnosis not present

## 2015-06-06 DIAGNOSIS — M25551 Pain in right hip: Secondary | ICD-10-CM | POA: Diagnosis not present

## 2015-06-06 DIAGNOSIS — M9904 Segmental and somatic dysfunction of sacral region: Secondary | ICD-10-CM | POA: Diagnosis not present

## 2015-06-06 DIAGNOSIS — M9903 Segmental and somatic dysfunction of lumbar region: Secondary | ICD-10-CM | POA: Diagnosis not present

## 2015-06-06 DIAGNOSIS — M5136 Other intervertebral disc degeneration, lumbar region: Secondary | ICD-10-CM | POA: Diagnosis not present

## 2015-06-06 DIAGNOSIS — M9905 Segmental and somatic dysfunction of pelvic region: Secondary | ICD-10-CM | POA: Diagnosis not present

## 2015-06-09 DIAGNOSIS — M5136 Other intervertebral disc degeneration, lumbar region: Secondary | ICD-10-CM | POA: Diagnosis not present

## 2015-06-09 DIAGNOSIS — M25551 Pain in right hip: Secondary | ICD-10-CM | POA: Diagnosis not present

## 2015-06-09 DIAGNOSIS — M9904 Segmental and somatic dysfunction of sacral region: Secondary | ICD-10-CM | POA: Diagnosis not present

## 2015-06-09 DIAGNOSIS — M9905 Segmental and somatic dysfunction of pelvic region: Secondary | ICD-10-CM | POA: Diagnosis not present

## 2015-06-09 DIAGNOSIS — M9903 Segmental and somatic dysfunction of lumbar region: Secondary | ICD-10-CM | POA: Diagnosis not present

## 2015-06-28 ENCOUNTER — Ambulatory Visit: Payer: Medicare Other | Admitting: Adult Health

## 2015-06-28 ENCOUNTER — Ambulatory Visit (INDEPENDENT_AMBULATORY_CARE_PROVIDER_SITE_OTHER): Payer: Medicare Other | Admitting: Adult Health

## 2015-06-28 ENCOUNTER — Encounter: Payer: Self-pay | Admitting: Adult Health

## 2015-06-28 VITALS — BP 138/62 | Temp 98.7°F | Ht 69.0 in | Wt 160.0 lb

## 2015-06-28 DIAGNOSIS — H35371 Puckering of macula, right eye: Secondary | ICD-10-CM | POA: Diagnosis not present

## 2015-06-28 DIAGNOSIS — H43813 Vitreous degeneration, bilateral: Secondary | ICD-10-CM | POA: Diagnosis not present

## 2015-06-28 DIAGNOSIS — I10 Essential (primary) hypertension: Secondary | ICD-10-CM

## 2015-06-28 DIAGNOSIS — Z961 Presence of intraocular lens: Secondary | ICD-10-CM | POA: Diagnosis not present

## 2015-06-28 DIAGNOSIS — H353130 Nonexudative age-related macular degeneration, bilateral, stage unspecified: Secondary | ICD-10-CM | POA: Diagnosis not present

## 2015-06-28 NOTE — Progress Notes (Signed)
Subjective:    Patient ID: Mark Velasquez, male    DOB: 09/30/1916, 82 y.o.   MRN: [id number]  HPI  82 year old male who presents to the office today for one month follow up regarding hypertension. When I last saw him on 05/31/2015 at which time his blood pressure was 170/80. Prior to that he saw his PCP at which time his blood pressure was in the 123XX123 systolic and he was having episodes of  lightheaded when he stood up. He was told to stop taking Sectral at that time   During his last visit I increased his lisinopril from 20 mg to 30 mg  Today in the office he reports that his blood pressures have been between 0000000- 0000000 systolic. He has not had any episodes of syncope or near syncope. He denies feeling lightheaded.   Review of Systems  Constitutional: Negative.   Respiratory: Negative.   Cardiovascular: Negative.   Neurological: Negative.   All other systems reviewed and are negative.    Past Medical History  Diagnosis Date  . Hypertension   . Dyslipidemia   . Chronic kidney disease     renal failure  . Gout   . BPH (benign prostatic hyperplasia)   . Prostate cancer ([place]) 1999  . Femur fracture ([place])   . Hearing loss     Social History   Social History  . Marital Status: Married    Spouse Name: N/A  . Number of Children: N/A  . Years of Education: N/A   Occupational History  . Not on file.   Social History Main Topics  . Smoking status: Never Smoker   . Smokeless tobacco: Not on file  . Alcohol Use: No  . Drug Use: No  . Sexual Activity: Not on file     Comment: regular exercise - yes   Other Topics Concern  . 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       Four children, two in [place], one in [place] and one in [place].           Past Surgical History  Procedure Laterality Date  . Orif hip fracture      right  .  Vertebroplasty    . Appendectomy    . Tonsillectomy    . Prostatectomy    . Cystoscopy      bladder neck contracture    Family History  Problem Relation Age of Onset  . Hypertension Mother   . Pneumonia Mother   . Stroke Father   . Parkinsonism Father     Allergies  Allergen Reactions  . Iodinated Diagnostic Agents Nausea And Vomiting    Current Outpatient Prescriptions on File Prior to Visit  Medication Sig Dispense Refill  . allopurinol (ZYLOPRIM) 300 MG tablet TAKE 1 TABLET DAILY 100 tablet 1  . aspirin 81 MG tablet Take 81 mg by mouth daily.      . beta carotene 10000 UNIT capsule Take 10,000 Units by mouth daily.    . Calcium Carbonate-Vitamin D 500-50 MG-UNIT CAPS Take 500 tablets by mouth daily.      . ferrous sulfate 325 (65 FE) MG tablet Take 325 mg by mouth daily with breakfast.    . fish oil-omega-3 fatty acids 1000 MG capsule Take 1 g  by mouth daily.     . furosemide (LASIX) 20 MG tablet Take 1 tablet (20 mg total) by mouth daily. 100 tablet 3  . lisinopril (PRINIVIL,ZESTRIL) 30 MG tablet Take 1 tablet (30 mg total) by mouth daily. 90 tablet 3  . Lutein 20 MG CAPS Take 1 capsule by mouth daily.      . methocarbamol (ROBAXIN) 500 MG tablet Take 500 mg by mouth 4 (four) times daily.    . metoprolol succinate (TOPROL-XL) 50 MG 24 hr tablet TAKE 1 TABLET DAILY. TAKE WITH OR IMMEDIATELY FOLLOWING A MEAL 90 tablet 1  . mirabegron ER (MYRBETRIQ) 50 MG TB24 tablet Take 50 mg by mouth daily.    . Multiple Vitamins-Minerals (ICAPS) CAPS Take by mouth as directed.      . niacin 500 MG tablet Take 500 mg by mouth daily with breakfast.      . Propylene Glycol 0.95 % SOLN Apply to eye.    . tamsulosin (FLOMAX) 0.4 MG CAPS capsule TAKE ONE CAPSULE BY MOUTH EVERY DAY 100 capsule 3  . traMADol (ULTRAM) 50 MG tablet 1 by mouth each bedtime when necessary for pain 60 tablet 5   No current facility-administered medications on file prior to visit.    BP 138/62 mmHg  Temp(Src) 98.7  F (37.1 C) (Oral)  Ht 5\' 9"  (1.753 m)  Wt 160 lb (72.576 kg)  BMI 23.62 kg/m2       Objective:   Physical Exam  Constitutional: He is oriented to person, place, and time. He appears well-developed and well-nourished. No distress.  Cardiovascular: Normal rate, regular rhythm, normal heart sounds and intact distal pulses.  Exam reveals no gallop and no friction rub.   No murmur heard. Pulmonary/Chest: Effort normal and breath sounds normal. No respiratory distress. He has no wheezes. He has no rales. He exhibits no tenderness.  Neurological: He is alert and oriented to person, place, and time.  Skin: Skin is warm and dry. No rash noted. He is not diaphoretic. No erythema. No pallor.  Psychiatric: He has a normal mood and affect. His behavior is normal. Judgment and thought content normal.  Nursing note and vitals reviewed.      Assessment & Plan:  1. Essential hypertension - In the office today his blood pressure is 138/62 - Continue with current dose of medications. I am not going to change anything.  - Continue to monitor at home. Let me know if BP is consistently above 160/90 - Follow up as needed  Dorothyann Peng, NP

## 2015-06-28 NOTE — Patient Instructions (Signed)
It was great seeing you again  Keep your medication dosage as it is.   Continue to monitor your blood pressure and let me know if it is going above 150/90

## 2015-06-29 ENCOUNTER — Ambulatory Visit: Payer: Medicare Other | Admitting: Adult Health

## 2015-07-12 ENCOUNTER — Other Ambulatory Visit: Payer: Self-pay | Admitting: *Deleted

## 2015-07-12 DIAGNOSIS — M25551 Pain in right hip: Secondary | ICD-10-CM

## 2015-07-12 DIAGNOSIS — R351 Nocturia: Principal | ICD-10-CM

## 2015-07-12 DIAGNOSIS — N401 Enlarged prostate with lower urinary tract symptoms: Secondary | ICD-10-CM

## 2015-07-12 MED ORDER — METOPROLOL SUCCINATE ER 50 MG PO TB24: ORAL_TABLET | ORAL | Status: DC

## 2015-07-12 MED ORDER — TRAMADOL HCL 50 MG PO TABS: ORAL_TABLET | ORAL | Status: DC

## 2015-07-12 MED ORDER — FERROUS SULFATE 325 (65 FE) MG PO TABS: 325.0000 mg | ORAL_TABLET | Freq: Every day | ORAL | Status: DC

## 2015-07-12 MED ORDER — PROPYLENE GLYCOL 0.95 % OP SOLN: 1.0000 [drp] | Freq: Two times a day (BID) | OPHTHALMIC | Status: AC | PRN

## 2015-07-12 MED ORDER — LUTEIN 20 MG PO CAPS: 1.0000 | ORAL_CAPSULE | Freq: Every day | ORAL | Status: DC

## 2015-07-12 MED ORDER — TAMSULOSIN HCL 0.4 MG PO CAPS: ORAL_CAPSULE | ORAL | Status: DC

## 2015-07-12 NOTE — Telephone Encounter (Signed)
Patient walked in requesting refill on medications. Specifically, he requested the below medications be refilled via [facility]. Cory verbally approved meds.  Iron (#90 -1) Icaps (#90 -1) lutein caps (#90 -1) Metoprolol (#90 -1) Propylene Glycol 0.95% soln (#90 -1) Flomax (#90 -1)  Tramadol (#30-0)

## 2015-09-20 DIAGNOSIS — H353130 Nonexudative age-related macular degeneration, bilateral, stage unspecified: Secondary | ICD-10-CM | POA: Diagnosis not present

## 2015-09-20 DIAGNOSIS — H35371 Puckering of macula, right eye: Secondary | ICD-10-CM | POA: Diagnosis not present

## 2015-09-20 DIAGNOSIS — H43813 Vitreous degeneration, bilateral: Secondary | ICD-10-CM | POA: Diagnosis not present

## 2015-09-20 DIAGNOSIS — Z961 Presence of intraocular lens: Secondary | ICD-10-CM | POA: Diagnosis not present

## 2015-09-29 DIAGNOSIS — N3942 Incontinence without sensory awareness: Secondary | ICD-10-CM | POA: Diagnosis not present

## 2015-09-29 DIAGNOSIS — R351 Nocturia: Secondary | ICD-10-CM | POA: Diagnosis not present

## 2015-10-09 ENCOUNTER — Other Ambulatory Visit: Payer: Self-pay | Admitting: Adult Health

## 2015-10-09 MED ORDER — ALLOPURINOL 300 MG PO TABS: 300.0000 mg | ORAL_TABLET | Freq: Every day | ORAL | 3 refills | Status: DC

## 2015-10-09 NOTE — Telephone Encounter (Signed)
Medication sent to pharmacy  

## 2015-10-20 DIAGNOSIS — L602 Onychogryphosis: Secondary | ICD-10-CM | POA: Diagnosis not present

## 2015-10-20 DIAGNOSIS — M24572 Contracture, left ankle: Secondary | ICD-10-CM | POA: Diagnosis not present

## 2015-10-20 DIAGNOSIS — M2042 Other hammer toe(s) (acquired), left foot: Secondary | ICD-10-CM | POA: Diagnosis not present

## 2015-10-20 DIAGNOSIS — M24571 Contracture, right ankle: Secondary | ICD-10-CM | POA: Diagnosis not present

## 2015-10-20 DIAGNOSIS — L600 Ingrowing nail: Secondary | ICD-10-CM | POA: Diagnosis not present

## 2015-10-20 DIAGNOSIS — M2041 Other hammer toe(s) (acquired), right foot: Secondary | ICD-10-CM | POA: Diagnosis not present

## 2015-11-03 DIAGNOSIS — Z48817 Encounter for surgical aftercare following surgery on the skin and subcutaneous tissue: Secondary | ICD-10-CM | POA: Diagnosis not present

## 2015-11-03 DIAGNOSIS — L600 Ingrowing nail: Secondary | ICD-10-CM | POA: Diagnosis not present

## 2015-11-25 DIAGNOSIS — N390 Urinary tract infection, site not specified: Secondary | ICD-10-CM | POA: Diagnosis not present

## 2015-11-28 DIAGNOSIS — M7751 Other enthesopathy of right foot: Secondary | ICD-10-CM | POA: Diagnosis not present

## 2015-11-28 DIAGNOSIS — M722 Plantar fascial fibromatosis: Secondary | ICD-10-CM | POA: Diagnosis not present

## 2015-11-28 DIAGNOSIS — M7731 Calcaneal spur, right foot: Secondary | ICD-10-CM | POA: Diagnosis not present

## 2015-11-28 DIAGNOSIS — M24571 Contracture, right ankle: Secondary | ICD-10-CM | POA: Diagnosis not present

## 2015-11-28 DIAGNOSIS — M24572 Contracture, left ankle: Secondary | ICD-10-CM | POA: Diagnosis not present

## 2015-12-30 ENCOUNTER — Other Ambulatory Visit: Payer: Self-pay

## 2016-01-03 DIAGNOSIS — Z961 Presence of intraocular lens: Secondary | ICD-10-CM | POA: Diagnosis not present

## 2016-01-03 DIAGNOSIS — H35371 Puckering of macula, right eye: Secondary | ICD-10-CM | POA: Diagnosis not present

## 2016-01-03 DIAGNOSIS — H353130 Nonexudative age-related macular degeneration, bilateral, stage unspecified: Secondary | ICD-10-CM | POA: Diagnosis not present

## 2016-01-03 DIAGNOSIS — H43813 Vitreous degeneration, bilateral: Secondary | ICD-10-CM | POA: Diagnosis not present

## 2016-01-09 DIAGNOSIS — R109 Unspecified abdominal pain: Secondary | ICD-10-CM | POA: Diagnosis not present

## 2016-01-09 DIAGNOSIS — I10 Essential (primary) hypertension: Secondary | ICD-10-CM | POA: Diagnosis not present

## 2016-01-09 DIAGNOSIS — R339 Retention of urine, unspecified: Secondary | ICD-10-CM | POA: Diagnosis not present

## 2016-01-09 DIAGNOSIS — Z7982 Long term (current) use of aspirin: Secondary | ICD-10-CM | POA: Diagnosis not present

## 2016-01-09 DIAGNOSIS — R300 Dysuria: Secondary | ICD-10-CM | POA: Diagnosis not present

## 2016-01-09 DIAGNOSIS — Z8546 Personal history of malignant neoplasm of prostate: Secondary | ICD-10-CM | POA: Diagnosis not present

## 2016-01-09 DIAGNOSIS — Z79899 Other long term (current) drug therapy: Secondary | ICD-10-CM | POA: Diagnosis not present

## 2016-01-09 DIAGNOSIS — Z792 Long term (current) use of antibiotics: Secondary | ICD-10-CM | POA: Diagnosis not present

## 2016-01-09 DIAGNOSIS — R338 Other retention of urine: Secondary | ICD-10-CM | POA: Diagnosis not present

## 2016-01-09 DIAGNOSIS — Z91041 Radiographic dye allergy status: Secondary | ICD-10-CM | POA: Diagnosis not present

## 2016-01-11 DIAGNOSIS — R338 Other retention of urine: Secondary | ICD-10-CM | POA: Diagnosis not present

## 2016-01-12 DIAGNOSIS — N358 Other urethral stricture: Secondary | ICD-10-CM | POA: Diagnosis not present

## 2016-01-12 DIAGNOSIS — R339 Retention of urine, unspecified: Secondary | ICD-10-CM | POA: Diagnosis not present

## 2016-01-12 DIAGNOSIS — I10 Essential (primary) hypertension: Secondary | ICD-10-CM | POA: Diagnosis not present

## 2016-01-12 DIAGNOSIS — N368 Other specified disorders of urethra: Secondary | ICD-10-CM | POA: Diagnosis not present

## 2016-01-12 DIAGNOSIS — Z79899 Other long term (current) drug therapy: Secondary | ICD-10-CM | POA: Diagnosis not present

## 2016-01-12 DIAGNOSIS — Z7982 Long term (current) use of aspirin: Secondary | ICD-10-CM | POA: Diagnosis not present

## 2016-01-12 DIAGNOSIS — Z8546 Personal history of malignant neoplasm of prostate: Secondary | ICD-10-CM | POA: Diagnosis not present

## 2016-01-12 DIAGNOSIS — E785 Hyperlipidemia, unspecified: Secondary | ICD-10-CM | POA: Diagnosis not present

## 2016-01-12 DIAGNOSIS — Z91041 Radiographic dye allergy status: Secondary | ICD-10-CM | POA: Diagnosis not present

## 2016-01-12 DIAGNOSIS — N359 Urethral stricture, unspecified: Secondary | ICD-10-CM | POA: Diagnosis not present

## 2016-01-20 DIAGNOSIS — N359 Urethral stricture, unspecified: Secondary | ICD-10-CM | POA: Diagnosis not present

## 2016-02-17 DIAGNOSIS — N358 Other urethral stricture: Secondary | ICD-10-CM | POA: Diagnosis not present

## 2016-02-17 DIAGNOSIS — C61 Malignant neoplasm of prostate: Secondary | ICD-10-CM | POA: Diagnosis not present

## 2016-02-17 DIAGNOSIS — R339 Retention of urine, unspecified: Secondary | ICD-10-CM | POA: Diagnosis not present

## 2016-03-01 DIAGNOSIS — N358 Other urethral stricture: Secondary | ICD-10-CM | POA: Diagnosis not present

## 2016-03-01 DIAGNOSIS — C61 Malignant neoplasm of prostate: Secondary | ICD-10-CM | POA: Diagnosis not present

## 2016-04-11 DIAGNOSIS — C61 Malignant neoplasm of prostate: Secondary | ICD-10-CM | POA: Diagnosis not present

## 2016-04-11 DIAGNOSIS — N358 Other urethral stricture: Secondary | ICD-10-CM | POA: Diagnosis not present

## 2016-04-30 DIAGNOSIS — N359 Urethral stricture, unspecified: Secondary | ICD-10-CM | POA: Diagnosis not present

## 2016-04-30 DIAGNOSIS — C61 Malignant neoplasm of prostate: Secondary | ICD-10-CM | POA: Diagnosis not present

## 2016-04-30 DIAGNOSIS — R300 Dysuria: Secondary | ICD-10-CM | POA: Diagnosis not present

## 2016-05-01 DIAGNOSIS — R300 Dysuria: Secondary | ICD-10-CM | POA: Diagnosis not present

## 2016-05-08 DIAGNOSIS — N358 Other urethral stricture: Secondary | ICD-10-CM | POA: Diagnosis not present

## 2016-05-08 DIAGNOSIS — R300 Dysuria: Secondary | ICD-10-CM | POA: Diagnosis not present

## 2016-05-11 ENCOUNTER — Other Ambulatory Visit: Payer: Self-pay

## 2016-05-11 ENCOUNTER — Telehealth: Payer: Self-pay | Admitting: Adult Health

## 2016-05-11 DIAGNOSIS — I10 Essential (primary) hypertension: Secondary | ICD-10-CM

## 2016-05-11 MED ORDER — LISINOPRIL 30 MG PO TABS: 30.0000 mg | ORAL_TABLET | Freq: Every day | ORAL | 3 refills | Status: DC

## 2016-05-11 MED ORDER — ALLOPURINOL 300 MG PO TABS: 300.0000 mg | ORAL_TABLET | Freq: Every day | ORAL | 3 refills | Status: DC

## 2016-05-11 MED ORDER — METOPROLOL SUCCINATE ER 50 MG PO TB24: ORAL_TABLET | ORAL | 1 refills | Status: DC

## 2016-05-11 NOTE — Telephone Encounter (Signed)
Prescriptions have been refilled to preferred pharmacy.

## 2016-05-11 NOTE — Telephone Encounter (Signed)
Patient requesting RX refill on the following to be sent to [facility]  allopurinol (ZYLOPRIM) 300 MG tablet    lisinopril (PRINIVIL,ZESTRIL) 30 MG tablet metoprolol succinate (TOPROL-XL) 50 MG 24 hr tablet

## 2016-05-21 DIAGNOSIS — N359 Urethral stricture, unspecified: Secondary | ICD-10-CM | POA: Diagnosis not present

## 2016-05-21 DIAGNOSIS — C61 Malignant neoplasm of prostate: Secondary | ICD-10-CM | POA: Diagnosis not present

## 2016-05-21 DIAGNOSIS — R339 Retention of urine, unspecified: Secondary | ICD-10-CM | POA: Diagnosis not present

## 2016-05-21 DIAGNOSIS — R350 Frequency of micturition: Secondary | ICD-10-CM | POA: Diagnosis not present

## 2016-05-28 DIAGNOSIS — N3281 Overactive bladder: Secondary | ICD-10-CM | POA: Diagnosis not present

## 2016-05-28 DIAGNOSIS — R300 Dysuria: Secondary | ICD-10-CM | POA: Diagnosis not present

## 2016-05-28 DIAGNOSIS — N358 Other urethral stricture: Secondary | ICD-10-CM | POA: Diagnosis not present

## 2016-05-28 DIAGNOSIS — N2889 Other specified disorders of kidney and ureter: Secondary | ICD-10-CM | POA: Diagnosis not present

## 2016-05-28 DIAGNOSIS — R350 Frequency of micturition: Secondary | ICD-10-CM | POA: Diagnosis not present

## 2016-06-03 DIAGNOSIS — R339 Retention of urine, unspecified: Secondary | ICD-10-CM | POA: Diagnosis not present

## 2016-06-03 DIAGNOSIS — Z9181 History of falling: Secondary | ICD-10-CM | POA: Diagnosis not present

## 2016-06-03 DIAGNOSIS — Z8546 Personal history of malignant neoplasm of prostate: Secondary | ICD-10-CM | POA: Diagnosis not present

## 2016-06-03 DIAGNOSIS — M8588 Other specified disorders of bone density and structure, other site: Secondary | ICD-10-CM | POA: Diagnosis not present

## 2016-06-03 DIAGNOSIS — S0101XA Laceration without foreign body of scalp, initial encounter: Secondary | ICD-10-CM | POA: Diagnosis not present

## 2016-06-03 DIAGNOSIS — N289 Disorder of kidney and ureter, unspecified: Secondary | ICD-10-CM | POA: Diagnosis not present

## 2016-06-03 DIAGNOSIS — Z79899 Other long term (current) drug therapy: Secondary | ICD-10-CM | POA: Diagnosis not present

## 2016-06-03 DIAGNOSIS — S0990XA Unspecified injury of head, initial encounter: Secondary | ICD-10-CM | POA: Diagnosis not present

## 2016-06-03 DIAGNOSIS — S0190XA Unspecified open wound of unspecified part of head, initial encounter: Secondary | ICD-10-CM | POA: Diagnosis not present

## 2016-06-03 DIAGNOSIS — S098XXA Other specified injuries of head, initial encounter: Secondary | ICD-10-CM | POA: Diagnosis not present

## 2016-06-03 DIAGNOSIS — M47812 Spondylosis without myelopathy or radiculopathy, cervical region: Secondary | ICD-10-CM | POA: Diagnosis not present

## 2016-06-03 DIAGNOSIS — I10 Essential (primary) hypertension: Secondary | ICD-10-CM | POA: Diagnosis not present

## 2016-06-03 DIAGNOSIS — M5030 Other cervical disc degeneration, unspecified cervical region: Secondary | ICD-10-CM | POA: Diagnosis not present

## 2016-06-03 DIAGNOSIS — W19XXXA Unspecified fall, initial encounter: Secondary | ICD-10-CM | POA: Diagnosis not present

## 2016-06-03 DIAGNOSIS — Z7982 Long term (current) use of aspirin: Secondary | ICD-10-CM | POA: Diagnosis not present

## 2016-06-03 DIAGNOSIS — Z91041 Radiographic dye allergy status: Secondary | ICD-10-CM | POA: Diagnosis not present

## 2016-06-04 DIAGNOSIS — R339 Retention of urine, unspecified: Secondary | ICD-10-CM | POA: Diagnosis not present

## 2016-06-04 DIAGNOSIS — N358 Other urethral stricture: Secondary | ICD-10-CM | POA: Diagnosis not present

## 2016-06-04 DIAGNOSIS — Z4682 Encounter for fitting and adjustment of non-vascular catheter: Secondary | ICD-10-CM | POA: Diagnosis not present

## 2016-06-04 DIAGNOSIS — K5730 Diverticulosis of large intestine without perforation or abscess without bleeding: Secondary | ICD-10-CM | POA: Diagnosis not present

## 2016-06-04 DIAGNOSIS — I77811 Abdominal aortic ectasia: Secondary | ICD-10-CM | POA: Diagnosis not present

## 2016-06-04 DIAGNOSIS — N139 Obstructive and reflux uropathy, unspecified: Secondary | ICD-10-CM | POA: Diagnosis not present

## 2016-06-04 DIAGNOSIS — I700 Atherosclerosis of aorta: Secondary | ICD-10-CM | POA: Diagnosis not present

## 2016-06-06 ENCOUNTER — Ambulatory Visit (INDEPENDENT_AMBULATORY_CARE_PROVIDER_SITE_OTHER): Payer: Medicare Other | Admitting: Family Medicine

## 2016-06-06 ENCOUNTER — Encounter: Payer: Self-pay | Admitting: Family Medicine

## 2016-06-06 ENCOUNTER — Ambulatory Visit (INDEPENDENT_AMBULATORY_CARE_PROVIDER_SITE_OTHER)
Admission: RE | Admit: 2016-06-06 | Discharge: 2016-06-06 | Disposition: A | Discharge status: Home / Self Care | Payer: Medicare Other | Source: Ambulatory Visit | Attending: Family Medicine | Admitting: Family Medicine

## 2016-06-06 VITALS — BP 95/59 | HR 87 | Temp 97.6°F

## 2016-06-06 DIAGNOSIS — M25552 Pain in left hip: Secondary | ICD-10-CM

## 2016-06-06 DIAGNOSIS — M545 Low back pain, unspecified: Secondary | ICD-10-CM

## 2016-06-06 DIAGNOSIS — S79912A Unspecified injury of left hip, initial encounter: Secondary | ICD-10-CM | POA: Diagnosis not present

## 2016-06-06 MED ORDER — HYDROCODONE-ACETAMINOPHEN 5-325 MG PO TABS: 1.0000 | ORAL_TABLET | Freq: Four times a day (QID) | ORAL | 0 refills | Status: DC | PRN

## 2016-06-06 NOTE — Progress Notes (Signed)
   Subjective:    Patient ID: Mark Velasquez, male    DOB: 08-07-1916, 83 y.o.   MRN: [id number]  HPI Here with his wife and son for pain after recent falls. He fell at home on 06-01-16 and again on 06-03-16. Each time he remembers losing his balance and falling backwards, landing on his back. After the second fall he has had severe pain in the left lower back and left hip area. He has taken a few of his wife's hydrocodone pills to help with the pain. He says standing feels better than sitting. No radiation of pain or numbness into the legs. He had a compression fracture of L1 in 2011, and he says his current pain feels just like that did.    Review of Systems  Constitutional: Negative.   Respiratory: Negative.   Cardiovascular: Negative.   Gastrointestinal: Negative.   Musculoskeletal: Positive for back pain and gait problem.       Objective:   Physical Exam  Constitutional: He is oriented to person, place, and time.  In some pain, in a wheelchair, alert   Cardiovascular: Normal rate, regular rhythm, normal heart sounds and intact distal pulses.   Pulmonary/Chest: Effort normal and breath sounds normal. No respiratory distress. He has no wheezes. He has no rales.  Musculoskeletal:  He is tender in the lower back just to the left of the spine, also tender over the left posterior iliac crest. The hips have full ROM   Neurological: He is alert and oriented to person, place, and time.          Assessment & Plan:  Pain after a recent fall. We will get Xrays today of the left hip, pelvis, and lumbar spine. Use Norco for pain.  Alysia Penna, MD

## 2016-06-06 NOTE — Patient Instructions (Signed)
WE NOW OFFER    [facility] FAST TRACK!!!  SAME DAY Appointments for ACUTE CARE  Such as: Sprains, Injuries, cuts, abrasions, rashes, muscle pain, joint pain, back pain Colds, flu, sore throats, headache, allergies, cough, fever  Ear pain, sinus and eye infections Abdominal pain, nausea, vomiting, diarrhea, upset stomach Animal/insect bites  3 Easy Ways to Schedule: Walk-In Scheduling Call in scheduling Mychart Sign-up: [email or web address]         

## 2016-06-07 ENCOUNTER — Telehealth: Payer: Self-pay | Admitting: Family Medicine

## 2016-06-07 NOTE — Telephone Encounter (Signed)
Noted  

## 2016-06-07 NOTE — Telephone Encounter (Signed)
Pt has appointment tomorrow with Dr Paulla Fore at 2 pm. Wife states pt is in severe pain.  His son is supposed to arrive sometime today, and hopefully assist pt with appointment at 2 pm tomorrow with Dr Paulla Fore. FYI: wife is struggling to take care of pt.

## 2016-06-07 NOTE — Telephone Encounter (Signed)
Pt is still in a lot of pain, xray results were normal. What else can you recommend?

## 2016-06-07 NOTE — Telephone Encounter (Signed)
Patient Name: Mark Velasquez  DOB: 03/26/1916    Initial Comment Caller states husband was seen for severe back pain yesterday, sent for x-ray.    Nurse Assessment  Nurse: Mallie Mussel, RN, Alveta Heimlich Date/Time Eilene Ghazi Time): 06/07/2016 9:07:54 AM  Confirm and document reason for call. If symptomatic, describe symptoms. ---Caller states that her husband was seen yesterday for severe back pain and was sent for an x-ray. She is wanting to know what needs to be done next. She wants to know the results of the x-ray. He is not worse than when he was seen yesterday. Advised her that I will forward this to the office and someone from the office should be getting back in touch with her. She verbalized understanding.  Does the patient have any new or worsening symptoms? ---No     Guidelines    Guideline Title Affirmed Question Affirmed Notes       Final Dispositio

## 2016-06-07 NOTE — Telephone Encounter (Signed)
I will refer him to Sports Medicine for therapy and other modalities to help with pain

## 2016-06-07 NOTE — Addendum Note (Signed)
Addended by: Alysia Penna A on: 06/07/2016 10:16 AM   Modules accepted: Orders

## 2016-06-07 NOTE — Telephone Encounter (Signed)
Can you let pt know about upcoming appointment?

## 2016-06-07 NOTE — Telephone Encounter (Signed)
Pt given results of xrays and recommendation to see Sports Medicine. Nothing further needed at this time.

## 2016-06-08 ENCOUNTER — Ambulatory Visit: Payer: Medicare Other | Admitting: Sports Medicine

## 2016-06-09 DIAGNOSIS — N178 Other acute kidney failure: Secondary | ICD-10-CM | POA: Diagnosis not present

## 2016-06-09 DIAGNOSIS — R102 Pelvic and perineal pain: Secondary | ICD-10-CM | POA: Diagnosis not present

## 2016-06-09 DIAGNOSIS — I10 Essential (primary) hypertension: Secondary | ICD-10-CM | POA: Diagnosis not present

## 2016-06-09 DIAGNOSIS — Z79899 Other long term (current) drug therapy: Secondary | ICD-10-CM | POA: Diagnosis not present

## 2016-06-09 DIAGNOSIS — T83090A Other mechanical complication of cystostomy catheter, initial encounter: Secondary | ICD-10-CM | POA: Diagnosis present

## 2016-06-09 DIAGNOSIS — R9431 Abnormal electrocardiogram [ECG] [EKG]: Secondary | ICD-10-CM | POA: Diagnosis not present

## 2016-06-09 DIAGNOSIS — K5730 Diverticulosis of large intestine without perforation or abscess without bleeding: Secondary | ICD-10-CM | POA: Diagnosis not present

## 2016-06-09 DIAGNOSIS — Z9181 History of falling: Secondary | ICD-10-CM | POA: Diagnosis not present

## 2016-06-09 DIAGNOSIS — R296 Repeated falls: Secondary | ICD-10-CM | POA: Diagnosis not present

## 2016-06-09 DIAGNOSIS — I083 Combined rheumatic disorders of mitral, aortic and tricuspid valves: Secondary | ICD-10-CM | POA: Diagnosis not present

## 2016-06-09 DIAGNOSIS — E640 Sequelae of protein-calorie malnutrition: Secondary | ICD-10-CM | POA: Diagnosis not present

## 2016-06-09 DIAGNOSIS — I519 Heart disease, unspecified: Secondary | ICD-10-CM | POA: Diagnosis not present

## 2016-06-09 DIAGNOSIS — Z6820 Body mass index (BMI) 20.0-20.9, adult: Secondary | ICD-10-CM | POA: Diagnosis not present

## 2016-06-09 DIAGNOSIS — Z5189 Encounter for other specified aftercare: Secondary | ICD-10-CM | POA: Diagnosis not present

## 2016-06-09 DIAGNOSIS — N281 Cyst of kidney, acquired: Secondary | ICD-10-CM | POA: Diagnosis not present

## 2016-06-09 DIAGNOSIS — Z91041 Radiographic dye allergy status: Secondary | ICD-10-CM | POA: Diagnosis not present

## 2016-06-09 DIAGNOSIS — Z8546 Personal history of malignant neoplasm of prostate: Secondary | ICD-10-CM | POA: Diagnosis not present

## 2016-06-09 DIAGNOSIS — R339 Retention of urine, unspecified: Secondary | ICD-10-CM | POA: Diagnosis not present

## 2016-06-09 DIAGNOSIS — E43 Unspecified severe protein-calorie malnutrition: Secondary | ICD-10-CM | POA: Diagnosis not present

## 2016-06-09 DIAGNOSIS — I517 Cardiomegaly: Secondary | ICD-10-CM | POA: Diagnosis not present

## 2016-06-09 DIAGNOSIS — N179 Acute kidney failure, unspecified: Secondary | ICD-10-CM | POA: Diagnosis not present

## 2016-06-09 DIAGNOSIS — N359 Urethral stricture, unspecified: Secondary | ICD-10-CM | POA: Diagnosis not present

## 2016-06-09 DIAGNOSIS — E860 Dehydration: Secondary | ICD-10-CM | POA: Diagnosis present

## 2016-06-09 DIAGNOSIS — Z8042 Family history of malignant neoplasm of prostate: Secondary | ICD-10-CM | POA: Diagnosis not present

## 2016-06-09 DIAGNOSIS — R41841 Cognitive communication deficit: Secondary | ICD-10-CM | POA: Diagnosis not present

## 2016-06-09 DIAGNOSIS — Z48816 Encounter for surgical aftercare following surgery on the genitourinary system: Secondary | ICD-10-CM | POA: Diagnosis not present

## 2016-06-09 DIAGNOSIS — R54 Age-related physical debility: Secondary | ICD-10-CM | POA: Diagnosis not present

## 2016-06-09 DIAGNOSIS — T83098A Other mechanical complication of other indwelling urethral catheter, initial encounter: Secondary | ICD-10-CM | POA: Diagnosis not present

## 2016-06-09 DIAGNOSIS — E875 Hyperkalemia: Secondary | ICD-10-CM | POA: Diagnosis not present

## 2016-06-09 DIAGNOSIS — I77819 Aortic ectasia, unspecified site: Secondary | ICD-10-CM | POA: Diagnosis not present

## 2016-06-09 DIAGNOSIS — Z7982 Long term (current) use of aspirin: Secondary | ICD-10-CM | POA: Diagnosis not present

## 2016-06-09 DIAGNOSIS — I480 Paroxysmal atrial fibrillation: Secondary | ICD-10-CM | POA: Diagnosis present

## 2016-06-09 DIAGNOSIS — R410 Disorientation, unspecified: Secondary | ICD-10-CM | POA: Diagnosis present

## 2016-06-09 DIAGNOSIS — Z466 Encounter for fitting and adjustment of urinary device: Secondary | ICD-10-CM | POA: Diagnosis not present

## 2016-06-09 DIAGNOSIS — M6281 Muscle weakness (generalized): Secondary | ICD-10-CM | POA: Diagnosis not present

## 2016-06-09 DIAGNOSIS — M545 Low back pain: Secondary | ICD-10-CM | POA: Diagnosis not present

## 2016-06-11 ENCOUNTER — Ambulatory Visit: Payer: Medicare Other | Admitting: Sports Medicine

## 2016-06-14 DIAGNOSIS — N183 Chronic kidney disease, stage 3 (moderate): Secondary | ICD-10-CM | POA: Diagnosis not present

## 2016-06-14 DIAGNOSIS — Z48816 Encounter for surgical aftercare following surgery on the genitourinary system: Secondary | ICD-10-CM | POA: Diagnosis not present

## 2016-06-14 DIAGNOSIS — R296 Repeated falls: Secondary | ICD-10-CM | POA: Diagnosis not present

## 2016-06-14 DIAGNOSIS — E640 Sequelae of protein-calorie malnutrition: Secondary | ICD-10-CM | POA: Diagnosis not present

## 2016-06-14 DIAGNOSIS — R339 Retention of urine, unspecified: Secondary | ICD-10-CM | POA: Diagnosis not present

## 2016-06-14 DIAGNOSIS — N179 Acute kidney failure, unspecified: Secondary | ICD-10-CM | POA: Diagnosis not present

## 2016-06-14 DIAGNOSIS — I10 Essential (primary) hypertension: Secondary | ICD-10-CM | POA: Diagnosis not present

## 2016-06-14 DIAGNOSIS — R41841 Cognitive communication deficit: Secondary | ICD-10-CM | POA: Diagnosis not present

## 2016-06-14 DIAGNOSIS — T83090A Other mechanical complication of cystostomy catheter, initial encounter: Secondary | ICD-10-CM | POA: Diagnosis not present

## 2016-06-14 DIAGNOSIS — C61 Malignant neoplasm of prostate: Secondary | ICD-10-CM | POA: Diagnosis not present

## 2016-06-14 DIAGNOSIS — I4891 Unspecified atrial fibrillation: Secondary | ICD-10-CM | POA: Diagnosis not present

## 2016-06-14 DIAGNOSIS — M6281 Muscle weakness (generalized): Secondary | ICD-10-CM | POA: Diagnosis not present

## 2016-06-14 DIAGNOSIS — E875 Hyperkalemia: Secondary | ICD-10-CM | POA: Diagnosis not present

## 2016-06-14 DIAGNOSIS — R54 Age-related physical debility: Secondary | ICD-10-CM | POA: Diagnosis not present

## 2016-06-14 DIAGNOSIS — R410 Disorientation, unspecified: Secondary | ICD-10-CM | POA: Diagnosis not present

## 2016-06-14 DIAGNOSIS — N359 Urethral stricture, unspecified: Secondary | ICD-10-CM | POA: Diagnosis not present

## 2016-06-14 DIAGNOSIS — Z466 Encounter for fitting and adjustment of urinary device: Secondary | ICD-10-CM | POA: Diagnosis not present

## 2016-06-14 DIAGNOSIS — E43 Unspecified severe protein-calorie malnutrition: Secondary | ICD-10-CM | POA: Diagnosis not present

## 2016-06-14 DIAGNOSIS — I480 Paroxysmal atrial fibrillation: Secondary | ICD-10-CM | POA: Diagnosis not present

## 2016-06-14 DIAGNOSIS — Z9181 History of falling: Secondary | ICD-10-CM | POA: Diagnosis not present

## 2016-06-14 DIAGNOSIS — Z5189 Encounter for other specified aftercare: Secondary | ICD-10-CM | POA: Diagnosis not present

## 2016-06-14 DIAGNOSIS — N178 Other acute kidney failure: Secondary | ICD-10-CM | POA: Diagnosis not present

## 2016-06-14 DIAGNOSIS — E440 Moderate protein-calorie malnutrition: Secondary | ICD-10-CM | POA: Diagnosis not present

## 2016-06-18 DIAGNOSIS — R296 Repeated falls: Secondary | ICD-10-CM | POA: Diagnosis not present

## 2016-06-18 DIAGNOSIS — E440 Moderate protein-calorie malnutrition: Secondary | ICD-10-CM | POA: Diagnosis not present

## 2016-06-18 DIAGNOSIS — N183 Chronic kidney disease, stage 3 (moderate): Secondary | ICD-10-CM | POA: Diagnosis not present

## 2016-06-18 DIAGNOSIS — I4891 Unspecified atrial fibrillation: Secondary | ICD-10-CM | POA: Diagnosis not present

## 2016-06-18 DIAGNOSIS — I10 Essential (primary) hypertension: Secondary | ICD-10-CM | POA: Diagnosis not present

## 2016-06-20 DIAGNOSIS — N359 Urethral stricture, unspecified: Secondary | ICD-10-CM | POA: Diagnosis not present

## 2016-06-20 DIAGNOSIS — C61 Malignant neoplasm of prostate: Secondary | ICD-10-CM | POA: Diagnosis not present

## 2016-06-20 DIAGNOSIS — R339 Retention of urine, unspecified: Secondary | ICD-10-CM | POA: Diagnosis not present

## 2016-06-22 ENCOUNTER — Telehealth: Payer: Self-pay | Admitting: Adult Health

## 2016-06-22 NOTE — Telephone Encounter (Signed)
Mark Velasquez has been notified. Thanks!

## 2016-06-22 NOTE — Telephone Encounter (Signed)
[facility] called to state that the earliest they can start the PT, OT, Speech, Home RN aide first available on 06/24/2016.  They did receive the request 5/31 however since there is a delay they want you to know

## 2016-06-24 DIAGNOSIS — T83090D Other mechanical complication of cystostomy catheter, subsequent encounter: Secondary | ICD-10-CM | POA: Diagnosis not present

## 2016-06-24 DIAGNOSIS — H3530 Unspecified macular degeneration: Secondary | ICD-10-CM | POA: Diagnosis not present

## 2016-06-24 DIAGNOSIS — I480 Paroxysmal atrial fibrillation: Secondary | ICD-10-CM | POA: Diagnosis not present

## 2016-06-24 DIAGNOSIS — R339 Retention of urine, unspecified: Secondary | ICD-10-CM | POA: Diagnosis not present

## 2016-06-24 DIAGNOSIS — E43 Unspecified severe protein-calorie malnutrition: Secondary | ICD-10-CM | POA: Diagnosis not present

## 2016-06-24 DIAGNOSIS — I10 Essential (primary) hypertension: Secondary | ICD-10-CM | POA: Diagnosis not present

## 2016-06-26 DIAGNOSIS — T83090D Other mechanical complication of cystostomy catheter, subsequent encounter: Secondary | ICD-10-CM | POA: Diagnosis not present

## 2016-06-26 DIAGNOSIS — I480 Paroxysmal atrial fibrillation: Secondary | ICD-10-CM | POA: Diagnosis not present

## 2016-06-26 DIAGNOSIS — I10 Essential (primary) hypertension: Secondary | ICD-10-CM | POA: Diagnosis not present

## 2016-06-26 DIAGNOSIS — E43 Unspecified severe protein-calorie malnutrition: Secondary | ICD-10-CM | POA: Diagnosis not present

## 2016-06-26 DIAGNOSIS — H3530 Unspecified macular degeneration: Secondary | ICD-10-CM | POA: Diagnosis not present

## 2016-06-26 DIAGNOSIS — R339 Retention of urine, unspecified: Secondary | ICD-10-CM | POA: Diagnosis not present

## 2016-06-27 DIAGNOSIS — N358 Other urethral stricture: Secondary | ICD-10-CM | POA: Diagnosis not present

## 2016-06-27 DIAGNOSIS — I480 Paroxysmal atrial fibrillation: Secondary | ICD-10-CM | POA: Diagnosis not present

## 2016-06-27 DIAGNOSIS — E43 Unspecified severe protein-calorie malnutrition: Secondary | ICD-10-CM | POA: Diagnosis not present

## 2016-06-27 DIAGNOSIS — I10 Essential (primary) hypertension: Secondary | ICD-10-CM | POA: Diagnosis not present

## 2016-06-27 DIAGNOSIS — H3530 Unspecified macular degeneration: Secondary | ICD-10-CM | POA: Diagnosis not present

## 2016-06-27 DIAGNOSIS — R339 Retention of urine, unspecified: Secondary | ICD-10-CM | POA: Diagnosis not present

## 2016-06-27 DIAGNOSIS — T83090D Other mechanical complication of cystostomy catheter, subsequent encounter: Secondary | ICD-10-CM | POA: Diagnosis not present

## 2016-06-28 DIAGNOSIS — I10 Essential (primary) hypertension: Secondary | ICD-10-CM | POA: Diagnosis not present

## 2016-06-28 DIAGNOSIS — R339 Retention of urine, unspecified: Secondary | ICD-10-CM | POA: Diagnosis not present

## 2016-06-28 DIAGNOSIS — E43 Unspecified severe protein-calorie malnutrition: Secondary | ICD-10-CM | POA: Diagnosis not present

## 2016-06-28 DIAGNOSIS — T83090D Other mechanical complication of cystostomy catheter, subsequent encounter: Secondary | ICD-10-CM | POA: Diagnosis not present

## 2016-06-28 DIAGNOSIS — H3530 Unspecified macular degeneration: Secondary | ICD-10-CM | POA: Diagnosis not present

## 2016-06-28 DIAGNOSIS — I480 Paroxysmal atrial fibrillation: Secondary | ICD-10-CM | POA: Diagnosis not present

## 2016-06-29 DIAGNOSIS — I480 Paroxysmal atrial fibrillation: Secondary | ICD-10-CM | POA: Diagnosis not present

## 2016-06-29 DIAGNOSIS — T83090D Other mechanical complication of cystostomy catheter, subsequent encounter: Secondary | ICD-10-CM | POA: Diagnosis not present

## 2016-06-29 DIAGNOSIS — R339 Retention of urine, unspecified: Secondary | ICD-10-CM | POA: Diagnosis not present

## 2016-06-29 DIAGNOSIS — I10 Essential (primary) hypertension: Secondary | ICD-10-CM | POA: Diagnosis not present

## 2016-06-29 DIAGNOSIS — H3530 Unspecified macular degeneration: Secondary | ICD-10-CM | POA: Diagnosis not present

## 2016-06-29 DIAGNOSIS — E43 Unspecified severe protein-calorie malnutrition: Secondary | ICD-10-CM | POA: Diagnosis not present

## 2016-07-02 ENCOUNTER — Telehealth: Payer: Self-pay | Admitting: Adult Health

## 2016-07-02 NOTE — Telephone Encounter (Signed)
Please advise. Thanks.  

## 2016-07-02 NOTE — Telephone Encounter (Signed)
Ok with me 

## 2016-07-02 NOTE — Telephone Encounter (Signed)
OK with me.

## 2016-07-02 NOTE — Telephone Encounter (Signed)
Patient would like to transfer to Dr. Anitra Lauth at [facility] since they live closer. Is that okay?

## 2016-07-03 DIAGNOSIS — H3530 Unspecified macular degeneration: Secondary | ICD-10-CM | POA: Diagnosis not present

## 2016-07-03 DIAGNOSIS — T83090D Other mechanical complication of cystostomy catheter, subsequent encounter: Secondary | ICD-10-CM | POA: Diagnosis not present

## 2016-07-03 DIAGNOSIS — I10 Essential (primary) hypertension: Secondary | ICD-10-CM | POA: Diagnosis not present

## 2016-07-03 DIAGNOSIS — E43 Unspecified severe protein-calorie malnutrition: Secondary | ICD-10-CM | POA: Diagnosis not present

## 2016-07-03 DIAGNOSIS — R339 Retention of urine, unspecified: Secondary | ICD-10-CM | POA: Diagnosis not present

## 2016-07-03 DIAGNOSIS — I480 Paroxysmal atrial fibrillation: Secondary | ICD-10-CM | POA: Diagnosis not present

## 2016-07-03 NOTE — Telephone Encounter (Signed)
OK---whatever they want to do.

## 2016-07-03 NOTE — Telephone Encounter (Signed)
FYI

## 2016-07-03 NOTE — Telephone Encounter (Signed)
Patient's wife contacted office to schedule.  After this appointment was scheduled,  Patient's wife called back and argued that the [facility] office was not located in [place] and she did not want to come to this office and she would like to keep appointment with Tommi Rumps.

## 2016-07-04 ENCOUNTER — Ambulatory Visit: Payer: Medicare Other | Admitting: Family Medicine

## 2016-07-05 DIAGNOSIS — I480 Paroxysmal atrial fibrillation: Secondary | ICD-10-CM | POA: Diagnosis not present

## 2016-07-05 DIAGNOSIS — T83090D Other mechanical complication of cystostomy catheter, subsequent encounter: Secondary | ICD-10-CM | POA: Diagnosis not present

## 2016-07-05 DIAGNOSIS — H3530 Unspecified macular degeneration: Secondary | ICD-10-CM | POA: Diagnosis not present

## 2016-07-05 DIAGNOSIS — I10 Essential (primary) hypertension: Secondary | ICD-10-CM | POA: Diagnosis not present

## 2016-07-05 DIAGNOSIS — R339 Retention of urine, unspecified: Secondary | ICD-10-CM | POA: Diagnosis not present

## 2016-07-05 DIAGNOSIS — E43 Unspecified severe protein-calorie malnutrition: Secondary | ICD-10-CM | POA: Diagnosis not present

## 2016-07-06 ENCOUNTER — Ambulatory Visit (INDEPENDENT_AMBULATORY_CARE_PROVIDER_SITE_OTHER): Payer: Medicare Other | Admitting: Adult Health

## 2016-07-06 ENCOUNTER — Encounter: Payer: Self-pay | Admitting: Adult Health

## 2016-07-06 VITALS — BP 132/78 | Temp 98.0°F | Wt 134.0 lb

## 2016-07-06 DIAGNOSIS — N179 Acute kidney failure, unspecified: Secondary | ICD-10-CM | POA: Diagnosis not present

## 2016-07-06 DIAGNOSIS — R634 Abnormal weight loss: Secondary | ICD-10-CM | POA: Diagnosis not present

## 2016-07-06 MED ORDER — MIRTAZAPINE 15 MG PO TABS: 7.5000 mg | ORAL_TABLET | Freq: Every day | ORAL | 1 refills | Status: DC

## 2016-07-06 NOTE — Patient Instructions (Addendum)
You can stop taking Lasix, Myrbetriq, and Flomax as you have a surpapubic cath  You can take dulcolax daily to help with bowel movements   I have prescribed a medication called Remeron. This is to help with your appetite    Take 1/2 pill at night before bed.   Please follow up with me in one month to see how you are doing

## 2016-07-06 NOTE — Progress Notes (Signed)
Subjective:    Patient ID: Mark Velasquez, male    DOB: [birth year], 83 y.o.   MRN: [id number]  HPI  83 year old male who  has a past medical history of BPH (benign prostatic hyperplasia); Chronic kidney disease; Dyslipidemia; Femur fracture ([place]); Gout; Hearing loss; Hypertension; and Prostate cancer ([place]) (1999). He presents today for post rehab follow up. He was discharged on 06/22/2016 from [facility] after a hospital admission for acute kidney failure related to urinary obstruction. He had a suprapubic catheter placed. He is currently doing home rehab. He feels like he is progressing, slowly, but progressing.   His biggest concern today is that he feels as though he is a lack of appetite since being discharged and he continues to lose weight.    Review of Systems See HPI   Past Medical History:  Diagnosis Date  . BPH (benign prostatic hyperplasia)   . Chronic kidney disease    renal failure  . Dyslipidemia   . Femur fracture ([place])   . Gout   . Hearing loss   . Hypertension   . Prostate cancer [facility]) 1999    Social History   Social History  . Marital status: Married    Spouse name: N/A  . Number of children: N/A  . Years of education: N/A   Occupational History  . Not on file.   Social History Main Topics  . Smoking status: Never Smoker  . Smokeless tobacco: Never Used  . Alcohol use No  . Drug use: No  . Sexual activity: Not on file     Comment: regular exercise - yes   Other Topics Concern  . 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 Retired from the [place] in 1983.       Four children, two in [place], one in [place] and one in [place].           Past Surgical History:  Procedure Laterality Date  . APPENDECTOMY    . CYSTOSCOPY     bladder neck contracture  . ORIF HIP FRACTURE     right  . PROSTATECTOMY    . TONSILLECTOMY    . VERTEBROPLASTY       Family History  Problem Relation Age of Onset  . Hypertension Mother   . Pneumonia Mother   . Stroke Father   . Parkinsonism Father     Allergies  Allergen Reactions  . Iodinated Diagnostic Agents Nausea And Vomiting    Current Outpatient Prescriptions on File Prior to Visit  Medication Sig Dispense Refill  . allopurinol (ZYLOPRIM) 300 MG tablet Take 1 tablet (300 mg total) by mouth daily. 90 tablet 3  . aspirin 81 MG tablet Take 81 mg by mouth daily.      . beta carotene 10000 UNIT capsule Take 10,000 Units by mouth daily.    . Calcium Carbonate-Vitamin D 500-50 MG-UNIT CAPS Take 500 tablets by mouth daily.      . ferrous sulfate 325 (65 FE) MG tablet Take 1 tablet (325 mg total) by mouth daily with breakfast. 90 tablet 1  . fish oil-omega-3 fatty acids 1000 MG capsule Take 1 g by mouth daily.     Marland Kitchen lisinopril (PRINIVIL,ZESTRIL) 30 MG tablet Take 1 tablet (30 mg total) by mouth daily. 90 tablet 3  . Lutein 20 MG CAPS  Take 1 capsule (20 mg total) by mouth daily. 90 each 1  . metoprolol succinate (TOPROL-XL) 50 MG 24 hr tablet TAKE 1 TABLET DAILY. TAKE WITH OR IMMEDIATELY FOLLOWING A MEAL 90 tablet 1  . Multiple Vitamins-Minerals (ICAPS) CAPS Take by mouth as directed.      . niacin 500 MG tablet Take 500 mg by mouth daily with breakfast.      . Propylene Glycol 0.95 % SOLN Place 1 drop into both eyes 2 (two) times daily as needed. 90 each 1  . traMADol (ULTRAM) 50 MG tablet 1 by mouth each bedtime when necessary for pain 30 tablet 0   No current facility-administered medications on file prior to visit.     BP 132/78 (BP Location: Left Arm, Patient Position: Sitting, Cuff Size: Small)   Temp 98 F (36.7 C) (Oral)   Wt 134 lb (60.8 kg)   BMI 19.79 kg/m       Objective:   Physical Exam  Constitutional: He is oriented to person, place, and time. Vital signs are normal. He appears well-developed and well-nourished. He appears cachectic. No distress.  Cardiovascular:  Normal rate, regular rhythm, normal heart sounds and intact distal pulses.  Exam reveals no gallop and no friction rub.   No murmur heard. Pulmonary/Chest: Effort normal and breath sounds normal. No respiratory distress. He has no wheezes. He has no rales. He exhibits no tenderness.  Abdominal:  Suprapubic cath noted  Musculoskeletal:  Walks with steady gait with rolling walker   Neurological: He is alert and oriented to person, place, and time.  Skin: Skin is warm and dry. He is not diaphoretic.  Psychiatric: He has a normal mood and affect. His behavior is normal. Judgment and thought content normal.  Nursing note and vitals reviewed.     Assessment & Plan:  1. Weight loss - mirtazapine (REMERON) 15 MG tablet; Take 0.5 tablets (7.5 mg total) by mouth at bedtime.  Dispense: 45 tablet; Refill: 1 - Follow up in one month  - Do not take with Tramadol   2. AKI (acute kidney injury) ([place]) - resolved - I am going to d/c lasix, flomax, and myrbetriq since he has a suprapubic catheter.  - Follow up with urology   Dorothyann Peng, NP

## 2016-07-10 DIAGNOSIS — E43 Unspecified severe protein-calorie malnutrition: Secondary | ICD-10-CM | POA: Diagnosis not present

## 2016-07-10 DIAGNOSIS — I10 Essential (primary) hypertension: Secondary | ICD-10-CM | POA: Diagnosis not present

## 2016-07-10 DIAGNOSIS — I480 Paroxysmal atrial fibrillation: Secondary | ICD-10-CM | POA: Diagnosis not present

## 2016-07-10 DIAGNOSIS — T83090D Other mechanical complication of cystostomy catheter, subsequent encounter: Secondary | ICD-10-CM | POA: Diagnosis not present

## 2016-07-10 DIAGNOSIS — R339 Retention of urine, unspecified: Secondary | ICD-10-CM | POA: Diagnosis not present

## 2016-07-10 DIAGNOSIS — H3530 Unspecified macular degeneration: Secondary | ICD-10-CM | POA: Diagnosis not present

## 2016-07-13 DIAGNOSIS — R339 Retention of urine, unspecified: Secondary | ICD-10-CM | POA: Diagnosis not present

## 2016-07-13 DIAGNOSIS — I480 Paroxysmal atrial fibrillation: Secondary | ICD-10-CM | POA: Diagnosis not present

## 2016-07-13 DIAGNOSIS — H3530 Unspecified macular degeneration: Secondary | ICD-10-CM | POA: Diagnosis not present

## 2016-07-13 DIAGNOSIS — E43 Unspecified severe protein-calorie malnutrition: Secondary | ICD-10-CM | POA: Diagnosis not present

## 2016-07-13 DIAGNOSIS — I10 Essential (primary) hypertension: Secondary | ICD-10-CM | POA: Diagnosis not present

## 2016-07-13 DIAGNOSIS — T83090D Other mechanical complication of cystostomy catheter, subsequent encounter: Secondary | ICD-10-CM | POA: Diagnosis not present

## 2016-07-16 DIAGNOSIS — E43 Unspecified severe protein-calorie malnutrition: Secondary | ICD-10-CM | POA: Diagnosis not present

## 2016-07-16 DIAGNOSIS — I480 Paroxysmal atrial fibrillation: Secondary | ICD-10-CM | POA: Diagnosis not present

## 2016-07-16 DIAGNOSIS — R339 Retention of urine, unspecified: Secondary | ICD-10-CM | POA: Diagnosis not present

## 2016-07-16 DIAGNOSIS — H3530 Unspecified macular degeneration: Secondary | ICD-10-CM | POA: Diagnosis not present

## 2016-07-16 DIAGNOSIS — I10 Essential (primary) hypertension: Secondary | ICD-10-CM | POA: Diagnosis not present

## 2016-07-16 DIAGNOSIS — T83090D Other mechanical complication of cystostomy catheter, subsequent encounter: Secondary | ICD-10-CM | POA: Diagnosis not present

## 2016-07-18 DIAGNOSIS — I480 Paroxysmal atrial fibrillation: Secondary | ICD-10-CM | POA: Diagnosis not present

## 2016-07-18 DIAGNOSIS — R339 Retention of urine, unspecified: Secondary | ICD-10-CM | POA: Diagnosis not present

## 2016-07-18 DIAGNOSIS — T83090D Other mechanical complication of cystostomy catheter, subsequent encounter: Secondary | ICD-10-CM | POA: Diagnosis not present

## 2016-07-18 DIAGNOSIS — I10 Essential (primary) hypertension: Secondary | ICD-10-CM | POA: Diagnosis not present

## 2016-07-18 DIAGNOSIS — E43 Unspecified severe protein-calorie malnutrition: Secondary | ICD-10-CM | POA: Diagnosis not present

## 2016-07-18 DIAGNOSIS — H3530 Unspecified macular degeneration: Secondary | ICD-10-CM | POA: Diagnosis not present

## 2016-07-20 ENCOUNTER — Ambulatory Visit (INDEPENDENT_AMBULATORY_CARE_PROVIDER_SITE_OTHER): Payer: Medicare Other | Admitting: Family Medicine

## 2016-07-20 ENCOUNTER — Encounter: Payer: Self-pay | Admitting: Family Medicine

## 2016-07-20 VITALS — BP 138/78 | HR 91 | Temp 98.3°F | Ht 69.0 in | Wt 137.8 lb

## 2016-07-20 DIAGNOSIS — N179 Acute kidney failure, unspecified: Secondary | ICD-10-CM

## 2016-07-20 DIAGNOSIS — M545 Low back pain: Secondary | ICD-10-CM

## 2016-07-20 NOTE — Progress Notes (Signed)
Subjective:  Mark Velasquez is a 83 y.o. year old very pleasant male patient who presents for/with See problem oriented charting ROS- denies leg weakness. Uses suprapubic catheter so no incontinence. No saddle anesthesia. No paresthesias into leg.    Past Medical History-  Patient Active Problem List   Diagnosis Date Noted  . Syncope 06/01/2014  . Gynecomastia, male 11/18/2012  . Breast mass, right 08/19/2012  . Right hip pain 08/04/2012  . Hearing loss d/t noise 08/04/2012  . RENAL FAILURE 06/02/2009  . NEPHROLITHIASIS 06/02/2009  . GOUT 01/29/2008  . Essential hypertension 10/16/2006  . CARDIAC ARRHYTHMIA 10/16/2006  . BPH associated with nocturia 10/16/2006  . PROSTATE CANCER, HX OF 10/16/2006    Medications- reviewed and updated Current Outpatient Prescriptions  Medication Sig Dispense Refill  . allopurinol (ZYLOPRIM) 300 MG tablet Take 1 tablet (300 mg total) by mouth daily. 90 tablet 3  . aspirin 81 MG tablet Take 81 mg by mouth daily.      . beta carotene 10000 UNIT capsule Take 10,000 Units by mouth daily.    . Calcium Carbonate-Vitamin D 500-50 MG-UNIT CAPS Take 500 tablets by mouth daily.      . ferrous sulfate 325 (65 FE) MG tablet Take 1 tablet (325 mg total) by mouth daily with breakfast. 90 tablet 1  . fish oil-omega-3 fatty acids 1000 MG capsule Take 1 g by mouth daily.     Marland Kitchen lisinopril (PRINIVIL,ZESTRIL) 30 MG tablet Take 1 tablet (30 mg total) by mouth daily. 90 tablet 3  . Lutein 20 MG CAPS Take 1 capsule (20 mg total) by mouth daily. 90 each 1  . metoprolol succinate (TOPROL-XL) 50 MG 24 hr tablet TAKE 1 TABLET DAILY. TAKE WITH OR IMMEDIATELY FOLLOWING A MEAL 90 tablet 1  . mirtazapine (REMERON) 15 MG tablet Take 0.5 tablets (7.5 mg total) by mouth at bedtime. 45 tablet 1  . Multiple Vitamins-Minerals (ICAPS) CAPS Take by mouth as directed.      . niacin 500 MG tablet Take 500 mg by mouth daily with breakfast.      . Propylene Glycol 0.95 % SOLN Place 1 drop  into both eyes 2 (two) times daily as needed. 90 each 1  . traMADol (ULTRAM) 50 MG tablet 1 by mouth each bedtime when necessary for pain 30 tablet 0   No current facility-administered medications for this visit.     Objective: BP 138/78 (BP Location: Left Arm, Patient Position: Sitting, Cuff Size: Normal)   Pulse 91   Temp 98.3 F (36.8 C) (Oral)   Ht 5\' 9"  (1.753 m)   Wt 137 lb 12.8 oz (62.5 kg)   SpO2 96%   BMI 20.35 kg/m  Gen: NAD, resting comfortably CV: RRR- occasional ectopic beat Lungs: CTAB no crackles, wheeze, rhonchi Abdomen: thin Ext: no edema Skin: warm, dry Neuro: no leg weaknss Msk: minimal pain with palpation across low back and into buttocks  Assessment/Plan:  AKI (acute kidney injury) ([place]) - Plan: Basic metabolic panel Left low back pain S: Patient saw our offie 5-6 weeks ago around 5-16 after a fall. Had pinpoint pain and thought musculoskeletal. X-rays of hip and lumbar spine reassuring at that time. Unfortunately, appears he ate and drank less afte rall and he ended up with acuite kidney injury with creatinine up to 2.55. Also had urinary retention with blocked suprapubic catheter- which when replaced had good yield of urine. He was discharged to rehab after hospital stay.   He has had continued  pain in loewr back- previously reported as right lower back- today pointing more toward left low back. He states he actually had 2 falls before his hospitalization but fortunately has been more steady since that time and without falls. He states Standing straight up helps the back pain. At times pain can get up to 6-7/10. Hard to get out of bed this morning- mornings often the worst. Has some home tramadol but not sure if it is ok to use this. Doing home health PT for mobility and low back pain after rehab. He admits pain has gotten slightly better over last few weeks A/P: 6 weeks of low back pain after mechanical fall. Discussed imaging (but already done at beginning of  this) as well as option of prednisone trial or ortho referral- he declines. He asks about conservative care. See avs plan but our goal is to get max pain down to 4/10 or less and consider other options if pain does not improve over next few weeks.   No labs in our system to prove resolution of AKI- opted to repeat BMET today as well Patient Instructions  Please stop by lab before you go Make sure kidneys are looking ok  Schedule tylenol 325mg  (two tablets morning, afternoon, and before bed- about 6 hours apart)  Try this for 3 days  Goal to get pain down to 4-5 maximum- If not at goal, can use half tablet of tramadol 50mg  twice a day  Try this for 3 days  If not at goal, can use full tablet tramadol twice a day  If not at goal within 10 days- follow up with Dorothyann Peng, NP  Orders Placed This Encounter  Procedures  . Basic metabolic panel    Standing Status:   Future    Number of Occurrences:   1    Standing Expiration Date:   07/20/2017   Return precautions advised.  Garret Reddish, MD

## 2016-07-20 NOTE — Patient Instructions (Addendum)
Please stop by lab before you go Make sure kidneys are looking ok  Schedule tylenol 325mg  (two tablets morning, afternoon, and before bed- about 6 hours apart)  Try this for 3 days  Goal to get pain down to 4-5 maximum- If not at goal, can use half tablet of tramadol 50mg  twice a day  Try this for 3 days  If not at goal, can use full tablet tramadol twice a day  If not at goal within 10 days- follow up with Dorothyann Peng, NP

## 2016-07-21 LAB — BASIC METABOLIC PANEL
BUN: 28 mg/dL — ABNORMAL HIGH (ref 7–25)
CHLORIDE: 106 mmol/L (ref 98–110)
CO2: 23 mmol/L (ref 20–31)
CREATININE: 1.31 mg/dL — AB (ref 0.70–1.11)
Calcium: 8.8 mg/dL (ref 8.6–10.3)
Glucose, Bld: 98 mg/dL (ref 65–99)
Potassium: 4.7 mmol/L (ref 3.5–5.3)
Sodium: 140 mmol/L (ref 135–146)

## 2016-07-26 DIAGNOSIS — R339 Retention of urine, unspecified: Secondary | ICD-10-CM | POA: Diagnosis not present

## 2016-07-26 DIAGNOSIS — N358 Other urethral stricture: Secondary | ICD-10-CM | POA: Diagnosis not present

## 2016-08-07 ENCOUNTER — Ambulatory Visit (INDEPENDENT_AMBULATORY_CARE_PROVIDER_SITE_OTHER): Payer: Medicare Other | Admitting: Adult Health

## 2016-08-07 ENCOUNTER — Encounter: Payer: Self-pay | Admitting: Adult Health

## 2016-08-07 VITALS — BP 140/82 | HR 71 | Temp 97.4°F | Wt 136.7 lb

## 2016-08-07 DIAGNOSIS — M545 Low back pain, unspecified: Secondary | ICD-10-CM

## 2016-08-07 DIAGNOSIS — G8929 Other chronic pain: Secondary | ICD-10-CM

## 2016-08-07 MED ORDER — METHYLPREDNISOLONE 4 MG PO TBPK: ORAL_TABLET | ORAL | 0 refills | Status: DC

## 2016-08-07 NOTE — Progress Notes (Signed)
Subjective:    Patient ID: Mark Velasquez, male    DOB: [birth year], 83 y.o.   MRN: [id number]  HPI  83 year old male who  has a past medical history of BPH (benign prostatic hyperplasia); Chronic kidney disease; Dyslipidemia; Femur fracture ([place]); Gout; Hearing loss; Hypertension; and Prostate cancer ([place]) (1999). He presents to the office today for follow up regarding weight loss and lack of appetite s/p discharge from SNF. During the last visit he was started on Remeron7.5  mg nightly. He feels as though his weight has increased sightly.   He was seen by Dr. Yong Channel on 07/20/2016 for continuing left lower back pain. He denies any any recent falls. Pain is more apparent in the morning. Standing and exercises helps   He has been taking prescribed Tramadol and adding Tylenol as Dr. Yong Channel had recommended. He reports no improvement in pain since the last time he was here  He denies any numbness or tingling in his legs. No saddle anesthesia   Review of Systems See HPI   Past Medical History:  Diagnosis Date  . BPH (benign prostatic hyperplasia)   . Chronic kidney disease    renal failure  . Dyslipidemia   . Femur fracture ([place])   . Gout   . Hearing loss   . Hypertension   . Prostate cancer [facility]) 1999    Social History   Social History  . Marital status: Married    Spouse name: N/A  . Number of children: N/A  . Years of education: N/A   Occupational History  . Not on file.   Social History Main Topics  . Smoking status: Never Smoker  . Smokeless tobacco: Never Used  . Alcohol use No  . Drug use: No  . Sexual activity: Not on file     Comment: regular exercise - yes   Other Topics Concern  . 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       Four children, two in [place], one in [place] and one in [place].           Past Surgical History:    Procedure Laterality Date  . APPENDECTOMY    . CYSTOSCOPY     bladder neck contracture  . ORIF HIP FRACTURE     right  . PROSTATECTOMY    . TONSILLECTOMY    . VERTEBROPLASTY      Family History  Problem Relation Age of Onset  . Hypertension Mother   . Pneumonia Mother   . Stroke Father   . Parkinsonism Father     Allergies  Allergen Reactions  . Iodinated Diagnostic Agents Nausea And Vomiting    Current Outpatient Prescriptions on File Prior to Visit  Medication Sig Dispense Refill  . allopurinol (ZYLOPRIM) 300 MG tablet Take 1 tablet (300 mg total) by mouth daily. 90 tablet 3  . aspirin 81 MG tablet Take 81 mg by mouth daily.      . beta carotene 10000 UNIT capsule Take 10,000 Units by mouth daily.    . Calcium Carbonate-Vitamin D 500-50 MG-UNIT CAPS Take 500 tablets by mouth daily.      . ferrous sulfate 325 (65 FE) MG tablet Take 1 tablet (325 mg total) by mouth daily with breakfast. 90 tablet 1  .  fish oil-omega-3 fatty acids 1000 MG capsule Take 1 g by mouth daily.     Marland Kitchen lisinopril (PRINIVIL,ZESTRIL) 30 MG tablet Take 1 tablet (30 mg total) by mouth daily. 90 tablet 3  . Lutein 20 MG CAPS Take 1 capsule (20 mg total) by mouth daily. 90 each 1  . metoprolol succinate (TOPROL-XL) 50 MG 24 hr tablet TAKE 1 TABLET DAILY. TAKE WITH OR IMMEDIATELY FOLLOWING A MEAL 90 tablet 1  . mirtazapine (REMERON) 15 MG tablet Take 0.5 tablets (7.5 mg total) by mouth at bedtime. 45 tablet 1  . Multiple Vitamins-Minerals (ICAPS) CAPS Take by mouth as directed.      . niacin 500 MG tablet Take 500 mg by mouth daily with breakfast.      . Propylene Glycol 0.95 % SOLN Place 1 drop into both eyes 2 (two) times daily as needed. 90 each 1  . traMADol (ULTRAM) 50 MG tablet 1 by mouth each bedtime when necessary for pain 30 tablet 0   No current facility-administered medications on file prior to visit.     BP 140/82 (BP Location: Left Arm, Patient Position: Sitting, Cuff Size: Normal)   Pulse  71   Temp (!) 97.4 F (36.3 C) (Oral)   Wt 136 lb 11.2 oz (62 kg)   SpO2 96%   BMI 20.19 kg/m       Objective:   Physical Exam  Constitutional: He is oriented to person, place, and time. He appears well-developed and well-nourished. No distress.  Cardiovascular: Normal rate, regular rhythm, normal heart sounds and intact distal pulses.  Exam reveals no gallop and no friction rub.   No murmur heard. Musculoskeletal: Normal range of motion. He exhibits tenderness (slight tenderness to left lower back. No spinal tenderness). He exhibits no edema or deformity.  Neurological: He is alert and oriented to person, place, and time.  Skin: Skin is warm and dry. No rash noted. He is not diaphoretic. No erythema. No pallor.  Psychiatric: He has a normal mood and affect. His behavior is normal. Judgment and thought content normal.  Nursing note and vitals reviewed.     Assessment & Plan:  1. Chronic left-sided low back pain without sciatica - Appears to be muscular in nature.  - Has had xrays for this done already which were negative for fracture  - Will prescribe prednisone at this time. He is reluctant to go to PT  - methylPREDNISolone (MEDROL DOSEPAK) 4 MG TBPK tablet; Take as directed  Dispense: 21 tablet; Refill: 0 - Follow up if no improvement  Dorothyann Peng, NP

## 2016-08-09 ENCOUNTER — Telehealth: Payer: Self-pay | Admitting: Adult Health

## 2016-08-09 DIAGNOSIS — Z0289 Encounter for other administrative examinations: Secondary | ICD-10-CM

## 2016-08-09 NOTE — Telephone Encounter (Signed)
The patient mailed in an Independent and Supportive Living Physician's Report Form Mail to: [place] at [facility] [address] [place], [place] [address] Disposition: Dr's Folder

## 2016-08-20 ENCOUNTER — Telehealth: Payer: Self-pay | Admitting: Adult Health

## 2016-08-20 DIAGNOSIS — M25551 Pain in right hip: Secondary | ICD-10-CM

## 2016-08-20 NOTE — Telephone Encounter (Signed)
Son states patient needs pharmacy on file changed to [facility] on [address].  Son states patient told him he needed a refill on Tramadol.

## 2016-08-21 MED ORDER — TRAMADOL HCL 50 MG PO TABS: ORAL_TABLET | ORAL | 0 refills | Status: DC

## 2016-08-21 NOTE — Telephone Encounter (Signed)
Son called to switch this pharmacy again. Pt is now at [place] and needs a pharmacy that will deliver.  pLease change pharmacy to   [facility]:  [phone]  traMADol (ULTRAM) 50 MG tablet

## 2016-08-21 NOTE — Telephone Encounter (Signed)
If this has not been filled in a year does he needed it?

## 2016-08-21 NOTE — Telephone Encounter (Signed)
Last filled 07/12/15 #30 Last seen 08/07/16 for sciatica No future appointment scheduled

## 2016-08-21 NOTE — Telephone Encounter (Signed)
See bottom message.  Pt is requesting a refill.

## 2016-08-21 NOTE — Telephone Encounter (Signed)
Called to [facility] and given to the pharmacist.

## 2016-08-21 NOTE — Telephone Encounter (Signed)
Ok to refill for 30 days  

## 2016-08-22 DIAGNOSIS — N3942 Incontinence without sensory awareness: Secondary | ICD-10-CM | POA: Diagnosis not present

## 2016-08-22 DIAGNOSIS — N320 Bladder-neck obstruction: Secondary | ICD-10-CM | POA: Diagnosis not present

## 2016-08-22 DIAGNOSIS — R3914 Feeling of incomplete bladder emptying: Secondary | ICD-10-CM | POA: Diagnosis not present

## 2016-08-23 ENCOUNTER — Telehealth: Payer: Self-pay | Admitting: Adult Health

## 2016-08-23 MED ORDER — MELOXICAM 7.5 MG PO TABS: 7.5000 mg | ORAL_TABLET | Freq: Two times a day (BID) | ORAL | 0 refills | Status: DC

## 2016-08-23 NOTE — Telephone Encounter (Signed)
Son Mark Velasquez is calling to ask advice on what they could do at this point for pt due to all the back pain he is experiencing.  Son states nothing is giving pt any relief and the wife is calling    However, son is not on the [place]. I will fax him one today, however, in the meantime, he would like someone to call pt and advise if anything else can be done.  Pt has been seen several times lately per son.

## 2016-08-23 NOTE — Telephone Encounter (Signed)
We can try  Mobic and/or physical therapy

## 2016-08-23 NOTE — Telephone Encounter (Signed)
Sent in one month supply of 7.5 mg to take bid to [facility].

## 2016-08-27 ENCOUNTER — Emergency Department (HOSPITAL_COMMUNITY): Payer: Medicare Other

## 2016-08-27 ENCOUNTER — Encounter (HOSPITAL_COMMUNITY): Payer: Self-pay | Admitting: *Deleted

## 2016-08-27 ENCOUNTER — Emergency Department (HOSPITAL_COMMUNITY)
Admission: EM | Admit: 2016-08-27 | Discharge: 2016-08-27 | Disposition: A | Discharge status: Home / Self Care | Payer: Medicare Other | Attending: Emergency Medicine | Admitting: Emergency Medicine

## 2016-08-27 DIAGNOSIS — Z79899 Other long term (current) drug therapy: Secondary | ICD-10-CM | POA: Diagnosis not present

## 2016-08-27 DIAGNOSIS — Y939 Activity, unspecified: Secondary | ICD-10-CM | POA: Diagnosis not present

## 2016-08-27 DIAGNOSIS — Y999 Unspecified external cause status: Secondary | ICD-10-CM | POA: Insufficient documentation

## 2016-08-27 DIAGNOSIS — F0390 Unspecified dementia without behavioral disturbance: Secondary | ICD-10-CM | POA: Insufficient documentation

## 2016-08-27 DIAGNOSIS — I129 Hypertensive chronic kidney disease with stage 1 through stage 4 chronic kidney disease, or unspecified chronic kidney disease: Secondary | ICD-10-CM | POA: Insufficient documentation

## 2016-08-27 DIAGNOSIS — N189 Chronic kidney disease, unspecified: Secondary | ICD-10-CM | POA: Diagnosis not present

## 2016-08-27 DIAGNOSIS — R4182 Altered mental status, unspecified: Secondary | ICD-10-CM | POA: Diagnosis not present

## 2016-08-27 DIAGNOSIS — Y929 Unspecified place or not applicable: Secondary | ICD-10-CM | POA: Diagnosis not present

## 2016-08-27 DIAGNOSIS — Y92129 Unspecified place in nursing home as the place of occurrence of the external cause: Secondary | ICD-10-CM | POA: Insufficient documentation

## 2016-08-27 DIAGNOSIS — Z7982 Long term (current) use of aspirin: Secondary | ICD-10-CM | POA: Diagnosis not present

## 2016-08-27 DIAGNOSIS — W1830XA Fall on same level, unspecified, initial encounter: Secondary | ICD-10-CM | POA: Diagnosis not present

## 2016-08-27 DIAGNOSIS — Z8546 Personal history of malignant neoplasm of prostate: Secondary | ICD-10-CM | POA: Insufficient documentation

## 2016-08-27 DIAGNOSIS — N390 Urinary tract infection, site not specified: Secondary | ICD-10-CM | POA: Diagnosis not present

## 2016-08-27 DIAGNOSIS — R404 Transient alteration of awareness: Secondary | ICD-10-CM | POA: Diagnosis not present

## 2016-08-27 DIAGNOSIS — R531 Weakness: Secondary | ICD-10-CM | POA: Diagnosis not present

## 2016-08-27 LAB — URINALYSIS, ROUTINE W REFLEX MICROSCOPIC
Bilirubin Urine: NEGATIVE
GLUCOSE, UA: NEGATIVE mg/dL
Ketones, ur: NEGATIVE mg/dL
NITRITE: POSITIVE — AB
PROTEIN: NEGATIVE mg/dL
SPECIFIC GRAVITY, URINE: 1.014 (ref 1.005–1.030)
SQUAMOUS EPITHELIAL / LPF: NONE SEEN
pH: 5 (ref 5.0–8.0)

## 2016-08-27 LAB — CBC WITH DIFFERENTIAL/PLATELET
Basophils Absolute: 0 10*3/uL (ref 0.0–0.1)
Basophils Relative: 0 %
EOS ABS: 0.1 10*3/uL (ref 0.0–0.7)
Eosinophils Relative: 1 %
HEMATOCRIT: 34.5 % — AB (ref 39.0–52.0)
HEMOGLOBIN: 11.6 g/dL — AB (ref 13.0–17.0)
LYMPHS ABS: 2.2 10*3/uL (ref 0.7–4.0)
LYMPHS PCT: 36 %
MCH: 30.2 pg (ref 26.0–34.0)
MCHC: 33.6 g/dL (ref 30.0–36.0)
MCV: 89.8 fL (ref 78.0–100.0)
MONOS PCT: 7 %
Monocytes Absolute: 0.4 10*3/uL (ref 0.1–1.0)
NEUTROS ABS: 3.4 10*3/uL (ref 1.7–7.7)
NEUTROS PCT: 56 %
Platelets: 198 10*3/uL (ref 150–400)
RBC: 3.84 MIL/uL — AB (ref 4.22–5.81)
RDW: 15.2 % (ref 11.5–15.5)
WBC: 6.1 10*3/uL (ref 4.0–10.5)

## 2016-08-27 LAB — COMPREHENSIVE METABOLIC PANEL
ALK PHOS: 99 U/L (ref 38–126)
ALT: 19 U/L (ref 17–63)
ANION GAP: 10 (ref 5–15)
AST: 22 U/L (ref 15–41)
Albumin: 3.2 g/dL — ABNORMAL LOW (ref 3.5–5.0)
BUN: 41 mg/dL — ABNORMAL HIGH (ref 6–20)
CALCIUM: 8.8 mg/dL — AB (ref 8.9–10.3)
CO2: 22 mmol/L (ref 22–32)
CREATININE: 2.09 mg/dL — AB (ref 0.61–1.24)
Chloride: 107 mmol/L (ref 101–111)
GFR calc non Af Amer: 25 mL/min — ABNORMAL LOW (ref >60)
GFR, EST AFRICAN AMERICAN: 29 mL/min — AB (ref >60)
Glucose, Bld: 93 mg/dL (ref 65–99)
Potassium: 4.7 mmol/L (ref 3.5–5.1)
Sodium: 139 mmol/L (ref 135–145)
TOTAL PROTEIN: 6.3 g/dL — AB (ref 6.5–8.1)
Total Bilirubin: 0.4 mg/dL (ref 0.3–1.2)

## 2016-08-27 MED ORDER — CEPHALEXIN 500 MG PO CAPS
500.0000 mg | ORAL_CAPSULE | Freq: Once | ORAL | Status: AC
  Administered 2016-08-27: 500 mg via ORAL
  Filled 2016-08-27: qty 1

## 2016-08-27 MED ORDER — CEPHALEXIN 500 MG PO CAPS: 500.0000 mg | ORAL_CAPSULE | Freq: Three times a day (TID) | ORAL | 0 refills | Status: DC

## 2016-08-27 NOTE — ED Notes (Signed)
Patient transported to CT 

## 2016-08-27 NOTE — ED Notes (Signed)
ED Provider at bedside. 

## 2016-08-27 NOTE — ED Provider Notes (Signed)
[facility] DEPT Provider Note   CSN: [id number] Arrival date & time: 08/27/16  1015     History   Chief Complaint Chief Complaint  Patient presents with  . Fall  . Altered Mental Status    HPI Mark Velasquez is a 83 y.o. male presenting with a recent fall and increased confusion from baseline. He has a past medical history significant for dementia, HTN, CKD, prostate cancer (foley placement), and femur fracture. He stated that as of this morning he thought he had fallen but was not sure where he was located during that time. The fall was witnessed by staff at the SNF but whom have not be available for discussion. In addition he notes that when he falls he recalls the events, does not lose bowel or bladder control nor is he acutely less alert following his falls. He has received staples for head trauma secodnary to these falls in the past. He adds that since 2016 he has been falling regularly with many falls taking place over pavement causing him to strike his head multiple times. He denied acute pain today other than in his left hip, and elbow. Although he is able to recall distant events, the fact that he has four children and has remarried following his first wife's passing, he seems unable to accurately describe recent events surrounding his fall and confusion. Patient states that he was at [facility] when he fell, but when informed that he was now at [facility], he paused in confusion to think. He did recall that his wife was present at some point this morning as he thought he was staying with her at the facility.   HPI  Past Medical History:  Diagnosis Date  . BPH (benign prostatic hyperplasia)   . Chronic kidney disease    renal failure  . Dyslipidemia   . Femur fracture ([place])   . Gout   . Hearing loss   . Hypertension   . Prostate cancer [facility]) 1999    Patient Active Problem List   Diagnosis Date Noted  . Syncope 06/01/2014  . Gynecomastia, male 11/18/2012    . Breast mass, right 08/19/2012  . Right hip pain 08/04/2012  . Hearing loss d/t noise 08/04/2012  . RENAL FAILURE 06/02/2009  . NEPHROLITHIASIS 06/02/2009  . GOUT 01/29/2008  . Essential hypertension 10/16/2006  . CARDIAC ARRHYTHMIA 10/16/2006  . BPH associated with nocturia 10/16/2006  . PROSTATE CANCER, HX OF 10/16/2006    Past Surgical History:  Procedure Laterality Date  . APPENDECTOMY    . CYSTOSCOPY     bladder neck contracture  . ORIF HIP FRACTURE     right  . PROSTATECTOMY    . TONSILLECTOMY    . VERTEBROPLASTY         Home Medications    Prior to Admission medications   Medication Sig Start Date End Date Taking? Authorizing Provider  allopurinol (ZYLOPRIM) 300 MG tablet Take 1 tablet (300 mg total) by mouth daily. 05/11/16   Nafziger, Tommi Rumps, NP  aspirin 81 MG tablet Take 81 mg by mouth daily.      [provider]  beta carotene 10000 UNIT capsule Take 10,000 Units by mouth daily.    [provider]  Calcium Carbonate-Vitamin D 500-50 MG-UNIT CAPS Take 500 tablets by mouth daily.      [provider]  ferrous sulfate 325 (65 FE) MG tablet Take 1 tablet (325 mg total) by mouth daily with breakfast. 07/12/15   Nafziger, Tommi Rumps,  NP  fish oil-omega-3 fatty acids 1000 MG capsule Take 1 g by mouth daily.     [provider]  lisinopril (PRINIVIL,ZESTRIL) 30 MG tablet Take 1 tablet (30 mg total) by mouth daily. 05/11/16   Nafziger, Tommi Rumps, NP  Lutein 20 MG CAPS Take 1 capsule (20 mg total) by mouth daily. 07/12/15   Nafziger, Tommi Rumps, NP  meloxicam (MOBIC) 7.5 MG tablet Take 1 tablet (7.5 mg total) by mouth 2 (two) times daily. 08/23/16   Nafziger, Tommi Rumps, NP  methylPREDNISolone (MEDROL DOSEPAK) 4 MG TBPK tablet Take as directed 08/07/16   Dorothyann Peng, NP  metoprolol succinate (TOPROL-XL) 50 MG 24 hr tablet TAKE 1 TABLET DAILY. TAKE WITH OR IMMEDIATELY FOLLOWING A MEAL 05/11/16   Nafziger, Tommi Rumps, NP  mirtazapine (REMERON) 15 MG tablet Take 0.5 tablets  (7.5 mg total) by mouth at bedtime. 07/06/16   Nafziger, Tommi Rumps, NP  Multiple Vitamins-Minerals (ICAPS) CAPS Take by mouth as directed.      [provider]  niacin 500 MG tablet Take 500 mg by mouth daily with breakfast.      [provider]  Propylene Glycol 0.95 % SOLN Place 1 drop into both eyes 2 (two) times daily as needed. 07/12/15   Nafziger, Tommi Rumps, NP  traMADol Veatrice Bourbon) 50 MG tablet 1 by mouth each bedtime when necessary for pain 08/21/16   Dorothyann Peng, NP    Family History Family History  Problem Relation Age of Onset  . Hypertension Mother   . Pneumonia Mother   . Stroke Father   . Parkinsonism Father     Social History Social History  Substance Use Topics  . Smoking status: Never Smoker  . Smokeless tobacco: Never Used  . Alcohol use No     Allergies   Iodinated diagnostic agents   Review of Systems Review of Systems  Unable to perform ROS: Dementia (Unable to completely assess due to underlying dementia)  Constitutional: Positive for activity change. Negative for appetite change.  Respiratory: Negative for cough, chest tightness and shortness of breath.   Cardiovascular: Negative for chest pain, palpitations and leg swelling.  Gastrointestinal: Negative for abdominal distention, abdominal pain and anal bleeding.  Neurological: Positive for syncope and weakness. Negative for dizziness, seizures, speech difficulty, light-headedness and headaches.  Psychiatric/Behavioral: Positive for confusion. Negative for agitation.     Physical Exam Updated Vital Signs BP (!) 158/73 (BP Location: Right Arm)   Pulse 73   Temp 98 F (36.7 C) (Oral)   Resp 18   SpO2 97%   Physical Exam  Constitutional: He appears well-nourished. No distress.  HENT:  Head: Normocephalic.  Mouth/Throat: No oropharyngeal exudate.  Eyes: EOM are normal.  Pupils constricted to <66mm, non-reactive   Neck: Normal range of motion. Neck supple.  Cardiovascular: Normal rate and  regular rhythm.   No murmur heard. Pulmonary/Chest: Effort normal and breath sounds normal. No respiratory distress. He has no wheezes.  Abdominal: Soft. Bowel sounds are normal. He exhibits no distension. There is no tenderness. There is no guarding.  Musculoskeletal: He exhibits edema (Mild <+1 peripheral edema). He exhibits no tenderness or deformity.  Skin: Skin is warm and dry. He is not diaphoretic. No erythema. No pallor.  Psychiatric: He has a normal mood and affect. His speech is tangential. He is slowed. Cognition and memory are impaired. He exhibits abnormal recent memory and abnormal remote memory.    ED Treatments / Results  Labs (all labs ordered are listed, but only abnormal results are displayed)  Labs Reviewed  CBC WITH DIFFERENTIAL/PLATELET  COMPREHENSIVE METABOLIC PANEL  URINALYSIS, ROUTINE W REFLEX MICROSCOPIC    EKG  EKG Interpretation None       Radiology No results found.  Procedures Procedures (including critical care time)  Medications Ordered in ED Medications - No data to display   Initial Impression / Assessment and Plan / ED Course  I have reviewed the triage vital signs and the nursing notes.  Pertinent labs & imaging results that were available during my care of the patient were reviewed by me and considered in my medical decision making (see chart for details).    Mr. Lovelady is a 83 year old male who presented with acute confusion as per spouse following a fall this morning at his SNF. He was placed there following multiple recent falls and increased confusion from baseline. Given his presentation with his history of falls, confusion, and striking his head a CT-head is warranted to continue his evaluation for possible intracranial process vs worsening dementia.   Final Clinical Impressions(s) / ED Diagnoses   Final diagnoses:  None    New Prescriptions New Prescriptions   No medications on file     Kathi Ludwig,  MD 08/27/16 1155    Tanna Furry, MD 08/27/16 1425

## 2016-08-27 NOTE — ED Notes (Signed)
Bed: OZ22 Expected date:  Expected time:  Means of arrival:  Comments: RM 19

## 2016-08-27 NOTE — Discharge Instructions (Signed)
Keflex as prescribed 3 times per day for 7 days.

## 2016-08-27 NOTE — ED Notes (Signed)
Heather RN called for report.

## 2016-08-27 NOTE — ED Triage Notes (Signed)
Per EMS pt from [facility] on [facility] with a c/o altered mental status, s/p fall this am. EMS reports they were called earlier this morning when pt fell, and pt was evaluated at this time, was A+O x 4 without any complaints. However, few hours later, pt's wife called EMS stating pt was confused, weak, unable to ambulate. EMS reports pt is hard of hearing, however he answered all of their questions appropriately.

## 2016-08-30 ENCOUNTER — Other Ambulatory Visit: Payer: Self-pay | Admitting: Adult Health

## 2016-08-30 ENCOUNTER — Other Ambulatory Visit: Payer: Self-pay | Admitting: Family Medicine

## 2016-08-30 DIAGNOSIS — R627 Adult failure to thrive: Secondary | ICD-10-CM

## 2016-08-30 NOTE — Telephone Encounter (Signed)
Son states [facility] needs an updated med refills.ASAP Contact at  [place]:   Shirlee More [phone]  *Called and spoke with Nira Conn who advised wife had been in charge of giving pt his meds, and she did not have ANY of the meds that pt should be taking on his med list.  Nira Conn has only been giving pt  the OTC vitamins, mobic and tramadol. (these had been prescribed  last)  Pt needs these refills sent to local pharmacy asap.   allopurinol (ZYLOPRIM) 300 MG tablet ferrous sulfate 325 (65 FE) MG tablet lisinopril (PRINIVIL,ZESTRIL) 30 MG tablet lisinopril (PRINIVIL,ZESTRIL) 30 MG tablet metoprolol succinate (TOPROL-XL) 50 MG 24 hr tablet mirtazapine (REMERON) 15 MG tablet traMADol (ULTRAM) 50 MG tablet  Refills sent to  [facility], [place] - [address] (they will deliver)  These OTC meds on his med list Nira Conn would like to know if you want pt to continue to take: aspirin 81 MG tablet 1/day Calcium Carbonate-Vitamin D 500-50 MG-UNIT CAPS 1/day fish oil-omega-3 fatty acids 1000 MG capsule 1 /day Lutein 20 MG CAPS 1/ day (they have been giving the ICAPS) niacin 500 MG tablet Propylene Glycol 0.95 % SOLN  Can fax updated med list to [phone]

## 2016-08-31 ENCOUNTER — Encounter: Payer: Self-pay | Admitting: Family Medicine

## 2016-08-31 DIAGNOSIS — Z8546 Personal history of malignant neoplasm of prostate: Secondary | ICD-10-CM | POA: Diagnosis not present

## 2016-08-31 DIAGNOSIS — N320 Bladder-neck obstruction: Secondary | ICD-10-CM | POA: Diagnosis not present

## 2016-08-31 DIAGNOSIS — N390 Urinary tract infection, site not specified: Secondary | ICD-10-CM | POA: Diagnosis not present

## 2016-08-31 DIAGNOSIS — G3184 Mild cognitive impairment, so stated: Secondary | ICD-10-CM | POA: Diagnosis not present

## 2016-08-31 DIAGNOSIS — E46 Unspecified protein-calorie malnutrition: Secondary | ICD-10-CM | POA: Diagnosis not present

## 2016-08-31 DIAGNOSIS — I4891 Unspecified atrial fibrillation: Secondary | ICD-10-CM | POA: Diagnosis not present

## 2016-08-31 DIAGNOSIS — I10 Essential (primary) hypertension: Secondary | ICD-10-CM | POA: Diagnosis not present

## 2016-08-31 DIAGNOSIS — R54 Age-related physical debility: Secondary | ICD-10-CM | POA: Diagnosis not present

## 2016-08-31 DIAGNOSIS — N184 Chronic kidney disease, stage 4 (severe): Secondary | ICD-10-CM | POA: Diagnosis not present

## 2016-08-31 DIAGNOSIS — H11149 Conjunctival xerosis, unspecified, unspecified eye: Secondary | ICD-10-CM | POA: Diagnosis not present

## 2016-08-31 DIAGNOSIS — M109 Gout, unspecified: Secondary | ICD-10-CM | POA: Diagnosis not present

## 2016-08-31 NOTE — Telephone Encounter (Signed)
Spoke to [facility] at [employer] [facility] [facility].   Since patient has been accepted by Hospice I am ok with her D/C'ing   1. Allopurinol - he has not been taking 2. Iron- he has not been taking 3. Lisinopril - he has not been taking 4. Metoprolol - He has not been taking 5. All OTC medications except for eye drops  Continue  - Remeron, Tramadol and Mobic

## 2016-08-31 NOTE — Telephone Encounter (Signed)
Updated in chart and sent to [facility] by fax.

## 2016-08-31 NOTE — Telephone Encounter (Signed)
Tramadol 50 mg every 8 hours as needed for pain.   Can continue with Remeron 15 mg QHS   All other medications can be D/c

## 2016-09-03 ENCOUNTER — Other Ambulatory Visit: Payer: Self-pay | Admitting: Adult Health

## 2016-09-03 DIAGNOSIS — N184 Chronic kidney disease, stage 4 (severe): Secondary | ICD-10-CM | POA: Diagnosis not present

## 2016-09-03 DIAGNOSIS — I10 Essential (primary) hypertension: Secondary | ICD-10-CM | POA: Diagnosis not present

## 2016-09-03 DIAGNOSIS — E46 Unspecified protein-calorie malnutrition: Secondary | ICD-10-CM | POA: Diagnosis not present

## 2016-09-03 DIAGNOSIS — G3184 Mild cognitive impairment, so stated: Secondary | ICD-10-CM | POA: Diagnosis not present

## 2016-09-03 DIAGNOSIS — M25551 Pain in right hip: Secondary | ICD-10-CM

## 2016-09-03 DIAGNOSIS — N390 Urinary tract infection, site not specified: Secondary | ICD-10-CM | POA: Diagnosis not present

## 2016-09-03 DIAGNOSIS — I4891 Unspecified atrial fibrillation: Secondary | ICD-10-CM | POA: Diagnosis not present

## 2016-09-04 ENCOUNTER — Telehealth: Payer: Self-pay

## 2016-09-04 NOTE — Telephone Encounter (Signed)
[place] @ Hospice called to report that pt is c/o increased pain. He is currently on Tramadol but this does not seem to be helping much. She would like to know if you could increase/change his pain medication. Pt is coming in tomorrow for OV.  Mark Velasquez

## 2016-09-04 NOTE — Telephone Encounter (Signed)
He is coming in for OV tomorrow if you don't mind writing script at that time. Thanks!

## 2016-09-04 NOTE — Telephone Encounter (Signed)
I am ok with trying Percoset 5/325 mg Q 6 H PRN   Will hospice be witting for these orders?

## 2016-09-05 ENCOUNTER — Telehealth: Payer: Self-pay | Admitting: Adult Health

## 2016-09-05 ENCOUNTER — Ambulatory Visit (INDEPENDENT_AMBULATORY_CARE_PROVIDER_SITE_OTHER): Admitting: Adult Health

## 2016-09-05 ENCOUNTER — Encounter: Payer: Self-pay | Admitting: Adult Health

## 2016-09-05 VITALS — BP 108/62 | Temp 97.6°F | Wt 136.0 lb

## 2016-09-05 DIAGNOSIS — G8929 Other chronic pain: Secondary | ICD-10-CM | POA: Diagnosis not present

## 2016-09-05 DIAGNOSIS — M545 Low back pain, unspecified: Secondary | ICD-10-CM

## 2016-09-05 DIAGNOSIS — I4891 Unspecified atrial fibrillation: Secondary | ICD-10-CM | POA: Diagnosis not present

## 2016-09-05 DIAGNOSIS — I10 Essential (primary) hypertension: Secondary | ICD-10-CM | POA: Diagnosis not present

## 2016-09-05 DIAGNOSIS — G3184 Mild cognitive impairment, so stated: Secondary | ICD-10-CM | POA: Diagnosis not present

## 2016-09-05 DIAGNOSIS — N390 Urinary tract infection, site not specified: Secondary | ICD-10-CM | POA: Diagnosis not present

## 2016-09-05 DIAGNOSIS — E46 Unspecified protein-calorie malnutrition: Secondary | ICD-10-CM | POA: Diagnosis not present

## 2016-09-05 DIAGNOSIS — N184 Chronic kidney disease, stage 4 (severe): Secondary | ICD-10-CM | POA: Diagnosis not present

## 2016-09-05 NOTE — Progress Notes (Signed)
Subjective:    Patient ID: Mark Velasquez, male    DOB: [birth year], 83 y.o.   MRN: [id number]  HPI  Mark Velasquez is a pleasant 83 year old male who  has a past medical history of BPH (benign prostatic hyperplasia); Chronic kidney disease; Dyslipidemia; Femur fracture ([place]); Gout; Hearing loss; Hypertension; and Prostate cancer ([place]) (1999).  He made an appointment today for inadequately controlled low back pain without sciatica. He reports that he believes that he was not receiving Tramadol that was written for him at [facility]. Over the last few days he has been getting this medication and endorses relief of his pain. He no longer has any concerns   Review of Systems See HPI   Past Medical History:  Diagnosis Date  . BPH (benign prostatic hyperplasia)   . Chronic kidney disease    renal failure  . Dyslipidemia   . Femur fracture ([place])   . Gout   . Hearing loss   . Hypertension   . Prostate cancer [facility]) 1999    Social History   Social History  . Marital status: Married    Spouse name: N/A  . Number of children: N/A  . Years of education: N/A   Occupational History  . Not on file.   Social History Main Topics  . Smoking status: Never Smoker  . Smokeless tobacco: Never Used  . Alcohol use No  . Drug use: No  . Sexual activity: Not on file     Comment: regular exercise - yes   Other Topics Concern  . 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 Retired from the [place] in 1983.       Four children, two in [place], one in [place] and one in [place].           Past Surgical History:  Procedure Laterality Date  . APPENDECTOMY    . CYSTOSCOPY     bladder neck contracture  . ORIF HIP FRACTURE     right  . PROSTATECTOMY    . TONSILLECTOMY    . VERTEBROPLASTY      Family History  Problem Relation Age of Onset  . Hypertension Mother   . Pneumonia Mother   . Stroke Father    . Parkinsonism Father     Allergies  Allergen Reactions  . Iodinated Diagnostic Agents Nausea And Vomiting    Current Outpatient Prescriptions on File Prior to Visit  Medication Sig Dispense Refill  . cephALEXin (KEFLEX) 500 MG capsule Take 1 capsule (500 mg total) by mouth 3 (three) times daily. 21 capsule 0  . meloxicam (MOBIC) 7.5 MG tablet Take 1 tablet (7.5 mg total) by mouth 2 (two) times daily. 60 tablet 0  . mirtazapine (REMERON) 15 MG tablet Take 0.5 tablets (7.5 mg total) by mouth at bedtime. 45 tablet 1  . Propylene Glycol 0.95 % SOLN Place 1 drop into both eyes 2 (two) times daily as needed. 90 each 1  . traMADol (ULTRAM) 50 MG tablet 1 by mouth each bedtime when necessary for pain 30 tablet 0   No current facility-administered medications on file prior to visit.     BP 108/62 (BP Location: Right Arm)   Temp 97.6 F (36.4 C) (Oral)   Wt 136 lb (61.7 kg)   BMI 18.97 kg/m  Objective:   Physical Exam  Constitutional: He appears well-developed and well-nourished. No distress.  Musculoskeletal:  Using a rolling walker   Neurological: He is alert.  Skin: Skin is warm and dry. No rash noted. No erythema. No pallor.  Psychiatric: He has a normal mood and affect. His behavior is normal. Judgment and thought content normal.  Nursing note and vitals reviewed.     Assessment & Plan:  1. Chronic bilateral low back pain without sciatica - Cree Kunert that I had spoke to Hospice this afternoon and since they will be seeing him often that I have allowed them to manage his pain. He is happy with this plan. Advised to please let me know if there is anything I can do   Dorothyann Peng, NP

## 2016-09-05 NOTE — Telephone Encounter (Signed)
Meloxicam sent in electronically.  Tramadol called in and given to pharmacist.

## 2016-09-05 NOTE — Telephone Encounter (Signed)
Ok to refill for 30 days  until Hospice takes over

## 2016-09-05 NOTE — Telephone Encounter (Signed)
Pt POA Mark Velasquez [phone] brought forms to be completed by physician and faxed to Attn: Lorie Apley @ [facility] at [facility]. One form is for medicaid and one is for physician orders. Placed forms in physician folder at front office area.

## 2016-09-06 DIAGNOSIS — N390 Urinary tract infection, site not specified: Secondary | ICD-10-CM | POA: Diagnosis not present

## 2016-09-06 DIAGNOSIS — N184 Chronic kidney disease, stage 4 (severe): Secondary | ICD-10-CM | POA: Diagnosis not present

## 2016-09-06 DIAGNOSIS — E46 Unspecified protein-calorie malnutrition: Secondary | ICD-10-CM | POA: Diagnosis not present

## 2016-09-06 DIAGNOSIS — G3184 Mild cognitive impairment, so stated: Secondary | ICD-10-CM | POA: Diagnosis not present

## 2016-09-06 DIAGNOSIS — I4891 Unspecified atrial fibrillation: Secondary | ICD-10-CM | POA: Diagnosis not present

## 2016-09-06 DIAGNOSIS — I10 Essential (primary) hypertension: Secondary | ICD-10-CM | POA: Diagnosis not present

## 2016-09-07 ENCOUNTER — Encounter: Payer: Self-pay | Admitting: Family Medicine

## 2016-09-10 ENCOUNTER — Telehealth: Payer: Self-pay | Admitting: Adult Health

## 2016-09-10 DIAGNOSIS — I4891 Unspecified atrial fibrillation: Secondary | ICD-10-CM | POA: Diagnosis not present

## 2016-09-10 DIAGNOSIS — E46 Unspecified protein-calorie malnutrition: Secondary | ICD-10-CM | POA: Diagnosis not present

## 2016-09-10 DIAGNOSIS — G3184 Mild cognitive impairment, so stated: Secondary | ICD-10-CM | POA: Diagnosis not present

## 2016-09-10 DIAGNOSIS — I10 Essential (primary) hypertension: Secondary | ICD-10-CM | POA: Diagnosis not present

## 2016-09-10 DIAGNOSIS — N390 Urinary tract infection, site not specified: Secondary | ICD-10-CM | POA: Diagnosis not present

## 2016-09-10 DIAGNOSIS — N184 Chronic kidney disease, stage 4 (severe): Secondary | ICD-10-CM | POA: Diagnosis not present

## 2016-09-10 NOTE — Telephone Encounter (Signed)
Son would like to know if pt's paperwork has been sent to the assisted living?  They need asap to get pt transferred.

## 2016-09-11 DIAGNOSIS — I4891 Unspecified atrial fibrillation: Secondary | ICD-10-CM | POA: Diagnosis not present

## 2016-09-11 DIAGNOSIS — G3184 Mild cognitive impairment, so stated: Secondary | ICD-10-CM | POA: Diagnosis not present

## 2016-09-11 DIAGNOSIS — N184 Chronic kidney disease, stage 4 (severe): Secondary | ICD-10-CM | POA: Diagnosis not present

## 2016-09-11 DIAGNOSIS — N390 Urinary tract infection, site not specified: Secondary | ICD-10-CM | POA: Diagnosis not present

## 2016-09-11 DIAGNOSIS — I10 Essential (primary) hypertension: Secondary | ICD-10-CM | POA: Diagnosis not present

## 2016-09-11 DIAGNOSIS — E46 Unspecified protein-calorie malnutrition: Secondary | ICD-10-CM | POA: Diagnosis not present

## 2016-09-11 NOTE — Telephone Encounter (Signed)
It was placed in the paperwork to be faxed over today

## 2016-09-11 NOTE — Telephone Encounter (Signed)
Mr Eads aware papers were faxed this afternoon, per Juliann Pulse

## 2016-09-11 NOTE — Telephone Encounter (Signed)
Son following up on paperwork for pt.  They are having to pay for round the clock homecare until this can be sent in. Aware 5 - 7 business days for turnaround, but hopes to get this expedited Thank you.Marland Kitchen

## 2016-09-12 NOTE — Telephone Encounter (Signed)
Spoke with Karl Bales, the other brother, informing him paperwork has been faxed to [place].

## 2016-09-13 DIAGNOSIS — I4891 Unspecified atrial fibrillation: Secondary | ICD-10-CM | POA: Diagnosis not present

## 2016-09-13 DIAGNOSIS — G3184 Mild cognitive impairment, so stated: Secondary | ICD-10-CM | POA: Diagnosis not present

## 2016-09-13 DIAGNOSIS — N184 Chronic kidney disease, stage 4 (severe): Secondary | ICD-10-CM | POA: Diagnosis not present

## 2016-09-13 DIAGNOSIS — I10 Essential (primary) hypertension: Secondary | ICD-10-CM | POA: Diagnosis not present

## 2016-09-13 DIAGNOSIS — E46 Unspecified protein-calorie malnutrition: Secondary | ICD-10-CM | POA: Diagnosis not present

## 2016-09-13 DIAGNOSIS — N390 Urinary tract infection, site not specified: Secondary | ICD-10-CM | POA: Diagnosis not present

## 2016-09-14 DIAGNOSIS — E46 Unspecified protein-calorie malnutrition: Secondary | ICD-10-CM | POA: Diagnosis not present

## 2016-09-14 DIAGNOSIS — N184 Chronic kidney disease, stage 4 (severe): Secondary | ICD-10-CM | POA: Diagnosis not present

## 2016-09-14 DIAGNOSIS — I4891 Unspecified atrial fibrillation: Secondary | ICD-10-CM | POA: Diagnosis not present

## 2016-09-14 DIAGNOSIS — G3184 Mild cognitive impairment, so stated: Secondary | ICD-10-CM | POA: Diagnosis not present

## 2016-09-14 DIAGNOSIS — N390 Urinary tract infection, site not specified: Secondary | ICD-10-CM | POA: Diagnosis not present

## 2016-09-14 DIAGNOSIS — I10 Essential (primary) hypertension: Secondary | ICD-10-CM | POA: Diagnosis not present

## 2016-09-18 DIAGNOSIS — G3184 Mild cognitive impairment, so stated: Secondary | ICD-10-CM | POA: Diagnosis not present

## 2016-09-18 DIAGNOSIS — N184 Chronic kidney disease, stage 4 (severe): Secondary | ICD-10-CM | POA: Diagnosis not present

## 2016-09-18 DIAGNOSIS — I10 Essential (primary) hypertension: Secondary | ICD-10-CM | POA: Diagnosis not present

## 2016-09-18 DIAGNOSIS — E46 Unspecified protein-calorie malnutrition: Secondary | ICD-10-CM | POA: Diagnosis not present

## 2016-09-18 DIAGNOSIS — N390 Urinary tract infection, site not specified: Secondary | ICD-10-CM | POA: Diagnosis not present

## 2016-09-18 DIAGNOSIS — I4891 Unspecified atrial fibrillation: Secondary | ICD-10-CM | POA: Diagnosis not present

## 2016-09-20 DIAGNOSIS — G3184 Mild cognitive impairment, so stated: Secondary | ICD-10-CM | POA: Diagnosis not present

## 2016-09-20 DIAGNOSIS — E46 Unspecified protein-calorie malnutrition: Secondary | ICD-10-CM | POA: Diagnosis not present

## 2016-09-20 DIAGNOSIS — N184 Chronic kidney disease, stage 4 (severe): Secondary | ICD-10-CM | POA: Diagnosis not present

## 2016-09-20 DIAGNOSIS — N390 Urinary tract infection, site not specified: Secondary | ICD-10-CM | POA: Diagnosis not present

## 2016-09-20 DIAGNOSIS — I4891 Unspecified atrial fibrillation: Secondary | ICD-10-CM | POA: Diagnosis not present

## 2016-09-20 DIAGNOSIS — I10 Essential (primary) hypertension: Secondary | ICD-10-CM | POA: Diagnosis not present

## 2016-09-21 DIAGNOSIS — N184 Chronic kidney disease, stage 4 (severe): Secondary | ICD-10-CM | POA: Diagnosis not present

## 2016-09-21 DIAGNOSIS — I10 Essential (primary) hypertension: Secondary | ICD-10-CM | POA: Diagnosis not present

## 2016-09-21 DIAGNOSIS — N390 Urinary tract infection, site not specified: Secondary | ICD-10-CM | POA: Diagnosis not present

## 2016-09-21 DIAGNOSIS — E46 Unspecified protein-calorie malnutrition: Secondary | ICD-10-CM | POA: Diagnosis not present

## 2016-09-21 DIAGNOSIS — G3184 Mild cognitive impairment, so stated: Secondary | ICD-10-CM | POA: Diagnosis not present

## 2016-09-21 DIAGNOSIS — I4891 Unspecified atrial fibrillation: Secondary | ICD-10-CM | POA: Diagnosis not present

## 2016-09-22 DIAGNOSIS — Z8546 Personal history of malignant neoplasm of prostate: Secondary | ICD-10-CM | POA: Diagnosis not present

## 2016-09-22 DIAGNOSIS — I4891 Unspecified atrial fibrillation: Secondary | ICD-10-CM | POA: Diagnosis not present

## 2016-09-22 DIAGNOSIS — H11149 Conjunctival xerosis, unspecified, unspecified eye: Secondary | ICD-10-CM | POA: Diagnosis not present

## 2016-09-22 DIAGNOSIS — N184 Chronic kidney disease, stage 4 (severe): Secondary | ICD-10-CM | POA: Diagnosis not present

## 2016-09-22 DIAGNOSIS — N320 Bladder-neck obstruction: Secondary | ICD-10-CM | POA: Diagnosis not present

## 2016-09-22 DIAGNOSIS — R54 Age-related physical debility: Secondary | ICD-10-CM | POA: Diagnosis not present

## 2016-09-22 DIAGNOSIS — M109 Gout, unspecified: Secondary | ICD-10-CM | POA: Diagnosis not present

## 2016-09-22 DIAGNOSIS — I10 Essential (primary) hypertension: Secondary | ICD-10-CM | POA: Diagnosis not present

## 2016-09-22 DIAGNOSIS — E46 Unspecified protein-calorie malnutrition: Secondary | ICD-10-CM | POA: Diagnosis not present

## 2016-09-22 DIAGNOSIS — N390 Urinary tract infection, site not specified: Secondary | ICD-10-CM | POA: Diagnosis not present

## 2016-09-22 DIAGNOSIS — G3184 Mild cognitive impairment, so stated: Secondary | ICD-10-CM | POA: Diagnosis not present

## 2016-09-24 DIAGNOSIS — H3530 Unspecified macular degeneration: Secondary | ICD-10-CM | POA: Diagnosis not present

## 2016-09-24 DIAGNOSIS — M25551 Pain in right hip: Secondary | ICD-10-CM | POA: Diagnosis not present

## 2016-09-24 DIAGNOSIS — R339 Retention of urine, unspecified: Secondary | ICD-10-CM | POA: Diagnosis not present

## 2016-09-24 DIAGNOSIS — K5900 Constipation, unspecified: Secondary | ICD-10-CM | POA: Diagnosis not present

## 2016-09-24 DIAGNOSIS — I679 Cerebrovascular disease, unspecified: Secondary | ICD-10-CM | POA: Diagnosis not present

## 2016-09-24 DIAGNOSIS — Z79899 Other long term (current) drug therapy: Secondary | ICD-10-CM | POA: Diagnosis not present

## 2016-09-24 DIAGNOSIS — G894 Chronic pain syndrome: Secondary | ICD-10-CM | POA: Diagnosis not present

## 2016-09-25 DIAGNOSIS — I4891 Unspecified atrial fibrillation: Secondary | ICD-10-CM | POA: Diagnosis not present

## 2016-09-25 DIAGNOSIS — N184 Chronic kidney disease, stage 4 (severe): Secondary | ICD-10-CM | POA: Diagnosis not present

## 2016-09-25 DIAGNOSIS — E46 Unspecified protein-calorie malnutrition: Secondary | ICD-10-CM | POA: Diagnosis not present

## 2016-09-25 DIAGNOSIS — N390 Urinary tract infection, site not specified: Secondary | ICD-10-CM | POA: Diagnosis not present

## 2016-09-25 DIAGNOSIS — I10 Essential (primary) hypertension: Secondary | ICD-10-CM | POA: Diagnosis not present

## 2016-09-25 DIAGNOSIS — G3184 Mild cognitive impairment, so stated: Secondary | ICD-10-CM | POA: Diagnosis not present

## 2016-09-27 DIAGNOSIS — I4891 Unspecified atrial fibrillation: Secondary | ICD-10-CM | POA: Diagnosis not present

## 2016-09-27 DIAGNOSIS — G3184 Mild cognitive impairment, so stated: Secondary | ICD-10-CM | POA: Diagnosis not present

## 2016-09-27 DIAGNOSIS — E46 Unspecified protein-calorie malnutrition: Secondary | ICD-10-CM | POA: Diagnosis not present

## 2016-09-27 DIAGNOSIS — N184 Chronic kidney disease, stage 4 (severe): Secondary | ICD-10-CM | POA: Diagnosis not present

## 2016-09-27 DIAGNOSIS — I10 Essential (primary) hypertension: Secondary | ICD-10-CM | POA: Diagnosis not present

## 2016-09-27 DIAGNOSIS — N390 Urinary tract infection, site not specified: Secondary | ICD-10-CM | POA: Diagnosis not present

## 2016-09-28 DIAGNOSIS — I10 Essential (primary) hypertension: Secondary | ICD-10-CM | POA: Diagnosis not present

## 2016-09-28 DIAGNOSIS — E46 Unspecified protein-calorie malnutrition: Secondary | ICD-10-CM | POA: Diagnosis not present

## 2016-09-28 DIAGNOSIS — I4891 Unspecified atrial fibrillation: Secondary | ICD-10-CM | POA: Diagnosis not present

## 2016-09-28 DIAGNOSIS — N184 Chronic kidney disease, stage 4 (severe): Secondary | ICD-10-CM | POA: Diagnosis not present

## 2016-09-28 DIAGNOSIS — N390 Urinary tract infection, site not specified: Secondary | ICD-10-CM | POA: Diagnosis not present

## 2016-09-28 DIAGNOSIS — G3184 Mild cognitive impairment, so stated: Secondary | ICD-10-CM | POA: Diagnosis not present

## 2016-10-01 DIAGNOSIS — I10 Essential (primary) hypertension: Secondary | ICD-10-CM | POA: Diagnosis not present

## 2016-10-01 DIAGNOSIS — G3184 Mild cognitive impairment, so stated: Secondary | ICD-10-CM | POA: Diagnosis not present

## 2016-10-01 DIAGNOSIS — N390 Urinary tract infection, site not specified: Secondary | ICD-10-CM | POA: Diagnosis not present

## 2016-10-01 DIAGNOSIS — N184 Chronic kidney disease, stage 4 (severe): Secondary | ICD-10-CM | POA: Diagnosis not present

## 2016-10-01 DIAGNOSIS — E46 Unspecified protein-calorie malnutrition: Secondary | ICD-10-CM | POA: Diagnosis not present

## 2016-10-01 DIAGNOSIS — I4891 Unspecified atrial fibrillation: Secondary | ICD-10-CM | POA: Diagnosis not present

## 2016-10-02 DIAGNOSIS — N184 Chronic kidney disease, stage 4 (severe): Secondary | ICD-10-CM | POA: Diagnosis not present

## 2016-10-02 DIAGNOSIS — G3184 Mild cognitive impairment, so stated: Secondary | ICD-10-CM | POA: Diagnosis not present

## 2016-10-02 DIAGNOSIS — E46 Unspecified protein-calorie malnutrition: Secondary | ICD-10-CM | POA: Diagnosis not present

## 2016-10-02 DIAGNOSIS — I4891 Unspecified atrial fibrillation: Secondary | ICD-10-CM | POA: Diagnosis not present

## 2016-10-02 DIAGNOSIS — I10 Essential (primary) hypertension: Secondary | ICD-10-CM | POA: Diagnosis not present

## 2016-10-02 DIAGNOSIS — N390 Urinary tract infection, site not specified: Secondary | ICD-10-CM | POA: Diagnosis not present

## 2016-10-04 DIAGNOSIS — N390 Urinary tract infection, site not specified: Secondary | ICD-10-CM | POA: Diagnosis not present

## 2016-10-04 DIAGNOSIS — N184 Chronic kidney disease, stage 4 (severe): Secondary | ICD-10-CM | POA: Diagnosis not present

## 2016-10-04 DIAGNOSIS — I10 Essential (primary) hypertension: Secondary | ICD-10-CM | POA: Diagnosis not present

## 2016-10-04 DIAGNOSIS — E46 Unspecified protein-calorie malnutrition: Secondary | ICD-10-CM | POA: Diagnosis not present

## 2016-10-04 DIAGNOSIS — I4891 Unspecified atrial fibrillation: Secondary | ICD-10-CM | POA: Diagnosis not present

## 2016-10-04 DIAGNOSIS — G3184 Mild cognitive impairment, so stated: Secondary | ICD-10-CM | POA: Diagnosis not present

## 2016-10-05 DIAGNOSIS — E46 Unspecified protein-calorie malnutrition: Secondary | ICD-10-CM | POA: Diagnosis not present

## 2016-10-05 DIAGNOSIS — N390 Urinary tract infection, site not specified: Secondary | ICD-10-CM | POA: Diagnosis not present

## 2016-10-05 DIAGNOSIS — I10 Essential (primary) hypertension: Secondary | ICD-10-CM | POA: Diagnosis not present

## 2016-10-05 DIAGNOSIS — G3184 Mild cognitive impairment, so stated: Secondary | ICD-10-CM | POA: Diagnosis not present

## 2016-10-05 DIAGNOSIS — N184 Chronic kidney disease, stage 4 (severe): Secondary | ICD-10-CM | POA: Diagnosis not present

## 2016-10-05 DIAGNOSIS — I4891 Unspecified atrial fibrillation: Secondary | ICD-10-CM | POA: Diagnosis not present

## 2016-10-08 DIAGNOSIS — N184 Chronic kidney disease, stage 4 (severe): Secondary | ICD-10-CM | POA: Diagnosis not present

## 2016-10-08 DIAGNOSIS — I10 Essential (primary) hypertension: Secondary | ICD-10-CM | POA: Diagnosis not present

## 2016-10-08 DIAGNOSIS — N390 Urinary tract infection, site not specified: Secondary | ICD-10-CM | POA: Diagnosis not present

## 2016-10-08 DIAGNOSIS — I4891 Unspecified atrial fibrillation: Secondary | ICD-10-CM | POA: Diagnosis not present

## 2016-10-08 DIAGNOSIS — E46 Unspecified protein-calorie malnutrition: Secondary | ICD-10-CM | POA: Diagnosis not present

## 2016-10-08 DIAGNOSIS — G3184 Mild cognitive impairment, so stated: Secondary | ICD-10-CM | POA: Diagnosis not present

## 2016-10-09 DIAGNOSIS — N184 Chronic kidney disease, stage 4 (severe): Secondary | ICD-10-CM | POA: Diagnosis not present

## 2016-10-09 DIAGNOSIS — E46 Unspecified protein-calorie malnutrition: Secondary | ICD-10-CM | POA: Diagnosis not present

## 2016-10-09 DIAGNOSIS — I10 Essential (primary) hypertension: Secondary | ICD-10-CM | POA: Diagnosis not present

## 2016-10-09 DIAGNOSIS — N390 Urinary tract infection, site not specified: Secondary | ICD-10-CM | POA: Diagnosis not present

## 2016-10-09 DIAGNOSIS — R3914 Feeling of incomplete bladder emptying: Secondary | ICD-10-CM | POA: Diagnosis not present

## 2016-10-09 DIAGNOSIS — G3184 Mild cognitive impairment, so stated: Secondary | ICD-10-CM | POA: Diagnosis not present

## 2016-10-09 DIAGNOSIS — I4891 Unspecified atrial fibrillation: Secondary | ICD-10-CM | POA: Diagnosis not present

## 2016-10-11 DIAGNOSIS — I4891 Unspecified atrial fibrillation: Secondary | ICD-10-CM | POA: Diagnosis not present

## 2016-10-11 DIAGNOSIS — E46 Unspecified protein-calorie malnutrition: Secondary | ICD-10-CM | POA: Diagnosis not present

## 2016-10-11 DIAGNOSIS — I10 Essential (primary) hypertension: Secondary | ICD-10-CM | POA: Diagnosis not present

## 2016-10-11 DIAGNOSIS — G3184 Mild cognitive impairment, so stated: Secondary | ICD-10-CM | POA: Diagnosis not present

## 2016-10-11 DIAGNOSIS — N184 Chronic kidney disease, stage 4 (severe): Secondary | ICD-10-CM | POA: Diagnosis not present

## 2016-10-11 DIAGNOSIS — N390 Urinary tract infection, site not specified: Secondary | ICD-10-CM | POA: Diagnosis not present

## 2016-10-16 DIAGNOSIS — I10 Essential (primary) hypertension: Secondary | ICD-10-CM | POA: Diagnosis not present

## 2016-10-16 DIAGNOSIS — E46 Unspecified protein-calorie malnutrition: Secondary | ICD-10-CM | POA: Diagnosis not present

## 2016-10-16 DIAGNOSIS — N390 Urinary tract infection, site not specified: Secondary | ICD-10-CM | POA: Diagnosis not present

## 2016-10-16 DIAGNOSIS — G3184 Mild cognitive impairment, so stated: Secondary | ICD-10-CM | POA: Diagnosis not present

## 2016-10-16 DIAGNOSIS — N184 Chronic kidney disease, stage 4 (severe): Secondary | ICD-10-CM | POA: Diagnosis not present

## 2016-10-16 DIAGNOSIS — I4891 Unspecified atrial fibrillation: Secondary | ICD-10-CM | POA: Diagnosis not present

## 2016-10-18 DIAGNOSIS — I10 Essential (primary) hypertension: Secondary | ICD-10-CM | POA: Diagnosis not present

## 2016-10-18 DIAGNOSIS — G3184 Mild cognitive impairment, so stated: Secondary | ICD-10-CM | POA: Diagnosis not present

## 2016-10-18 DIAGNOSIS — N184 Chronic kidney disease, stage 4 (severe): Secondary | ICD-10-CM | POA: Diagnosis not present

## 2016-10-18 DIAGNOSIS — E46 Unspecified protein-calorie malnutrition: Secondary | ICD-10-CM | POA: Diagnosis not present

## 2016-10-18 DIAGNOSIS — N390 Urinary tract infection, site not specified: Secondary | ICD-10-CM | POA: Diagnosis not present

## 2016-10-18 DIAGNOSIS — I4891 Unspecified atrial fibrillation: Secondary | ICD-10-CM | POA: Diagnosis not present

## 2016-10-19 DIAGNOSIS — I10 Essential (primary) hypertension: Secondary | ICD-10-CM | POA: Diagnosis not present

## 2016-10-19 DIAGNOSIS — E46 Unspecified protein-calorie malnutrition: Secondary | ICD-10-CM | POA: Diagnosis not present

## 2016-10-19 DIAGNOSIS — N390 Urinary tract infection, site not specified: Secondary | ICD-10-CM | POA: Diagnosis not present

## 2016-10-19 DIAGNOSIS — G3184 Mild cognitive impairment, so stated: Secondary | ICD-10-CM | POA: Diagnosis not present

## 2016-10-19 DIAGNOSIS — I4891 Unspecified atrial fibrillation: Secondary | ICD-10-CM | POA: Diagnosis not present

## 2016-10-19 DIAGNOSIS — N184 Chronic kidney disease, stage 4 (severe): Secondary | ICD-10-CM | POA: Diagnosis not present

## 2016-10-22 DIAGNOSIS — N390 Urinary tract infection, site not specified: Secondary | ICD-10-CM | POA: Diagnosis not present

## 2016-10-22 DIAGNOSIS — N320 Bladder-neck obstruction: Secondary | ICD-10-CM | POA: Diagnosis not present

## 2016-10-22 DIAGNOSIS — I4891 Unspecified atrial fibrillation: Secondary | ICD-10-CM | POA: Diagnosis not present

## 2016-10-22 DIAGNOSIS — N184 Chronic kidney disease, stage 4 (severe): Secondary | ICD-10-CM | POA: Diagnosis not present

## 2016-10-22 DIAGNOSIS — G894 Chronic pain syndrome: Secondary | ICD-10-CM | POA: Diagnosis not present

## 2016-10-22 DIAGNOSIS — M25551 Pain in right hip: Secondary | ICD-10-CM | POA: Diagnosis not present

## 2016-10-22 DIAGNOSIS — E46 Unspecified protein-calorie malnutrition: Secondary | ICD-10-CM | POA: Diagnosis not present

## 2016-10-22 DIAGNOSIS — M109 Gout, unspecified: Secondary | ICD-10-CM | POA: Diagnosis not present

## 2016-10-22 DIAGNOSIS — Z8546 Personal history of malignant neoplasm of prostate: Secondary | ICD-10-CM | POA: Diagnosis not present

## 2016-10-22 DIAGNOSIS — K5900 Constipation, unspecified: Secondary | ICD-10-CM | POA: Diagnosis not present

## 2016-10-22 DIAGNOSIS — F0390 Unspecified dementia without behavioral disturbance: Secondary | ICD-10-CM | POA: Diagnosis not present

## 2016-10-22 DIAGNOSIS — I679 Cerebrovascular disease, unspecified: Secondary | ICD-10-CM | POA: Diagnosis not present

## 2016-10-22 DIAGNOSIS — R54 Age-related physical debility: Secondary | ICD-10-CM | POA: Diagnosis not present

## 2016-10-22 DIAGNOSIS — H11149 Conjunctival xerosis, unspecified, unspecified eye: Secondary | ICD-10-CM | POA: Diagnosis not present

## 2016-10-22 DIAGNOSIS — F339 Major depressive disorder, recurrent, unspecified: Secondary | ICD-10-CM | POA: Diagnosis not present

## 2016-10-22 DIAGNOSIS — G3184 Mild cognitive impairment, so stated: Secondary | ICD-10-CM | POA: Diagnosis not present

## 2016-10-22 DIAGNOSIS — I10 Essential (primary) hypertension: Secondary | ICD-10-CM | POA: Diagnosis not present

## 2016-10-23 DIAGNOSIS — Z23 Encounter for immunization: Secondary | ICD-10-CM | POA: Diagnosis not present

## 2016-11-06 DIAGNOSIS — R3914 Feeling of incomplete bladder emptying: Secondary | ICD-10-CM | POA: Diagnosis not present

## 2016-11-12 DIAGNOSIS — G8929 Other chronic pain: Secondary | ICD-10-CM | POA: Diagnosis not present

## 2016-11-12 DIAGNOSIS — F418 Other specified anxiety disorders: Secondary | ICD-10-CM | POA: Diagnosis not present

## 2016-11-12 DIAGNOSIS — I10 Essential (primary) hypertension: Secondary | ICD-10-CM | POA: Diagnosis not present

## 2016-11-12 DIAGNOSIS — R0602 Shortness of breath: Secondary | ICD-10-CM | POA: Diagnosis not present

## 2016-11-12 DIAGNOSIS — R451 Restlessness and agitation: Secondary | ICD-10-CM | POA: Diagnosis not present

## 2016-11-12 DIAGNOSIS — R197 Diarrhea, unspecified: Secondary | ICD-10-CM | POA: Diagnosis not present

## 2016-11-13 DIAGNOSIS — H3530 Unspecified macular degeneration: Secondary | ICD-10-CM | POA: Diagnosis not present

## 2016-11-13 DIAGNOSIS — I679 Cerebrovascular disease, unspecified: Secondary | ICD-10-CM | POA: Diagnosis not present

## 2016-11-13 DIAGNOSIS — G894 Chronic pain syndrome: Secondary | ICD-10-CM | POA: Diagnosis not present

## 2016-11-13 DIAGNOSIS — I10 Essential (primary) hypertension: Secondary | ICD-10-CM | POA: Diagnosis not present

## 2016-11-17 ENCOUNTER — Encounter (HOSPITAL_COMMUNITY): Payer: Self-pay

## 2016-11-17 ENCOUNTER — Inpatient Hospital Stay (HOSPITAL_COMMUNITY)
Admission: EM | Admit: 2016-11-17 | Discharge: 2016-11-24 | DRG: 919 | Disposition: A | Discharge status: Skilled Nursing Facility | Attending: Internal Medicine | Admitting: Internal Medicine

## 2016-11-17 DIAGNOSIS — B961 Klebsiella pneumoniae [K. pneumoniae] as the cause of diseases classified elsewhere: Secondary | ICD-10-CM | POA: Diagnosis present

## 2016-11-17 DIAGNOSIS — E43 Unspecified severe protein-calorie malnutrition: Secondary | ICD-10-CM

## 2016-11-17 DIAGNOSIS — I480 Paroxysmal atrial fibrillation: Secondary | ICD-10-CM | POA: Diagnosis present

## 2016-11-17 DIAGNOSIS — K922 Gastrointestinal hemorrhage, unspecified: Secondary | ICD-10-CM | POA: Diagnosis present

## 2016-11-17 DIAGNOSIS — K625 Hemorrhage of anus and rectum: Secondary | ICD-10-CM | POA: Diagnosis not present

## 2016-11-17 DIAGNOSIS — R627 Adult failure to thrive: Secondary | ICD-10-CM | POA: Diagnosis present

## 2016-11-17 DIAGNOSIS — H9190 Unspecified hearing loss, unspecified ear: Secondary | ICD-10-CM | POA: Diagnosis present

## 2016-11-17 DIAGNOSIS — Z888 Allergy status to other drugs, medicaments and biological substances status: Secondary | ICD-10-CM

## 2016-11-17 DIAGNOSIS — Z8546 Personal history of malignant neoplasm of prostate: Secondary | ICD-10-CM

## 2016-11-17 DIAGNOSIS — Z7982 Long term (current) use of aspirin: Secondary | ICD-10-CM

## 2016-11-17 DIAGNOSIS — Z515 Encounter for palliative care: Secondary | ICD-10-CM | POA: Diagnosis not present

## 2016-11-17 DIAGNOSIS — R0902 Hypoxemia: Secondary | ICD-10-CM | POA: Diagnosis not present

## 2016-11-17 DIAGNOSIS — N390 Urinary tract infection, site not specified: Secondary | ICD-10-CM

## 2016-11-17 DIAGNOSIS — Z6821 Body mass index (BMI) 21.0-21.9, adult: Secondary | ICD-10-CM

## 2016-11-17 DIAGNOSIS — R410 Disorientation, unspecified: Secondary | ICD-10-CM | POA: Diagnosis present

## 2016-11-17 DIAGNOSIS — K91841 Postprocedural hemorrhage and hematoma of a digestive system organ or structure following other procedure: Secondary | ICD-10-CM | POA: Diagnosis not present

## 2016-11-17 DIAGNOSIS — Z79899 Other long term (current) drug therapy: Secondary | ICD-10-CM

## 2016-11-17 DIAGNOSIS — B965 Pseudomonas (aeruginosa) (mallei) (pseudomallei) as the cause of diseases classified elsewhere: Secondary | ICD-10-CM | POA: Diagnosis present

## 2016-11-17 DIAGNOSIS — K631 Perforation of intestine (nontraumatic): Secondary | ICD-10-CM | POA: Diagnosis present

## 2016-11-17 DIAGNOSIS — D72829 Elevated white blood cell count, unspecified: Secondary | ICD-10-CM | POA: Diagnosis present

## 2016-11-17 DIAGNOSIS — D62 Acute posthemorrhagic anemia: Secondary | ICD-10-CM

## 2016-11-17 DIAGNOSIS — N400 Enlarged prostate without lower urinary tract symptoms: Secondary | ICD-10-CM | POA: Diagnosis present

## 2016-11-17 DIAGNOSIS — I129 Hypertensive chronic kidney disease with stage 1 through stage 4 chronic kidney disease, or unspecified chronic kidney disease: Secondary | ICD-10-CM | POA: Diagnosis present

## 2016-11-17 DIAGNOSIS — N179 Acute kidney failure, unspecified: Secondary | ICD-10-CM | POA: Diagnosis not present

## 2016-11-17 DIAGNOSIS — F0390 Unspecified dementia without behavioral disturbance: Secondary | ICD-10-CM | POA: Diagnosis present

## 2016-11-17 DIAGNOSIS — Y838 Other surgical procedures as the cause of abnormal reaction of the patient, or of later complication, without mention of misadventure at the time of the procedure: Secondary | ICD-10-CM | POA: Diagnosis present

## 2016-11-17 DIAGNOSIS — E46 Unspecified protein-calorie malnutrition: Secondary | ICD-10-CM | POA: Diagnosis present

## 2016-11-17 DIAGNOSIS — B9689 Other specified bacterial agents as the cause of diseases classified elsewhere: Secondary | ICD-10-CM | POA: Diagnosis present

## 2016-11-17 DIAGNOSIS — Z66 Do not resuscitate: Secondary | ICD-10-CM | POA: Diagnosis present

## 2016-11-17 DIAGNOSIS — E785 Hyperlipidemia, unspecified: Secondary | ICD-10-CM | POA: Diagnosis present

## 2016-11-17 DIAGNOSIS — N184 Chronic kidney disease, stage 4 (severe): Secondary | ICD-10-CM | POA: Diagnosis present

## 2016-11-17 LAB — CBC WITH DIFFERENTIAL/PLATELET
BASOS ABS: 0 10*3/uL (ref 0.0–0.1)
BASOS PCT: 0 %
Eosinophils Absolute: 0.1 10*3/uL (ref 0.0–0.7)
Eosinophils Relative: 1 %
HEMATOCRIT: 33.5 % — AB (ref 39.0–52.0)
HEMOGLOBIN: 11 g/dL — AB (ref 13.0–17.0)
LYMPHS PCT: 14 %
Lymphs Abs: 1.5 10*3/uL (ref 0.7–4.0)
MCH: 29.9 pg (ref 26.0–34.0)
MCHC: 32.8 g/dL (ref 30.0–36.0)
MCV: 91 fL (ref 78.0–100.0)
MONOS PCT: 8 %
Monocytes Absolute: 0.9 10*3/uL (ref 0.1–1.0)
NEUTROS ABS: 8.2 10*3/uL — AB (ref 1.7–7.7)
NEUTROS PCT: 77 %
Platelets: 255 10*3/uL (ref 150–400)
RBC: 3.68 MIL/uL — ABNORMAL LOW (ref 4.22–5.81)
RDW: 15.6 % — ABNORMAL HIGH (ref 11.5–15.5)
WBC: 10.8 10*3/uL — ABNORMAL HIGH (ref 4.0–10.5)

## 2016-11-17 LAB — PROTIME-INR
INR: 1.24
Prothrombin Time: 15.5 seconds — ABNORMAL HIGH (ref 11.4–15.2)

## 2016-11-17 LAB — SAMPLE TO BLOOD BANK

## 2016-11-17 LAB — APTT: APTT: 32 s (ref 24–36)

## 2016-11-17 LAB — POC OCCULT BLOOD, ED: Fecal Occult Bld: POSITIVE — AB

## 2016-11-17 MED ORDER — LIDOCAINE HCL 2 % EX GEL
1.0000 "application " | Freq: Once | CUTANEOUS | Status: AC
  Administered 2016-11-18: 1 via TOPICAL
  Filled 2016-11-17: qty 11

## 2016-11-17 NOTE — ED Triage Notes (Signed)
Patient arrives by EMS from [facility] with complaints of rectal bleeding. Patient is in [place]. Patient pulled out his foley today and the hospice nurse replaced foley-patient has had bleeding and cherry colored urine noted in foley tubing. EMS states was called by SNF for rectal bleeding-patient is very hard of hearing and is a poor historian. BP 150/80 HR 100 CBG 139 O2 sat 92-94 RA O2 dependent-patient uses as needed.

## 2016-11-17 NOTE — ED Provider Notes (Signed)
[facility] DEPT Provider Note   CSN: [id number] Arrival date & time: 11/17/16  2238     History   Chief Complaint Chief Complaint  Patient presents with  . Rectal Bleeding   HPI  Blood pressure (!) 170/101, pulse (!) 102, temperature 97.7 F (36.5 C), temperature source Oral, resp. rate 19, SpO2 96 %.  Mark Velasquez is a 83 y.o. male BIBEMS from [facility] past Velasquez history significant for CKD, prostate cancer, on hospice care with chronic suprapubic cath, he accidentally accidentally pulled out the suprapubic catheter earlier in the day.  It was reinserted.  At SNF they noticed that he had some bloody stool and they sent him in for further evaluation.  Patient with no complaints, level 5 caveat secondary to confusion and dementia.  He reports no pain.  Past Velasquez History:  Diagnosis Date  . BPH (benign prostatic hyperplasia)   . Chronic kidney disease    renal failure  . Dyslipidemia   . Femur fracture ([place])   . Gout   . Hearing loss   . Hypertension   . Prostate cancer [facility]) 1999    Patient Active Problem List   Diagnosis Date Noted  . Lower GI bleeding 11/18/2016  . Syncope 06/01/2014  . Gynecomastia, male 11/18/2012  . Breast mass, right 08/19/2012  . Right hip pain 08/04/2012  . Hearing loss d/t noise 08/04/2012  . RENAL FAILURE 06/02/2009  . NEPHROLITHIASIS 06/02/2009  . GOUT 01/29/2008  . Essential hypertension 10/16/2006  . CARDIAC ARRHYTHMIA 10/16/2006  . BPH associated with nocturia 10/16/2006  . PROSTATE CANCER, HX OF 10/16/2006    Past Surgical History:  Procedure Laterality Date  . APPENDECTOMY    . CYSTOSCOPY     bladder neck contracture  . ORIF HIP FRACTURE     right  . PROSTATECTOMY    . TONSILLECTOMY    . VERTEBROPLASTY         Home Medications    Prior to Admission medications   Medication Sig Start Date End Date Taking? Authorizing Provider  antiseptic oral rinse (BIOTENE) LIQD 15 mLs  by Mouth Rinse route 3 (three) times daily after meals.   Yes [provider]  Artificial Saliva (ACT DRY MOUTH) LOZG Use as directed 1 lozenge in the mouth or throat at bedtime.   Yes [provider]  aspirin EC 81 MG tablet Take 81 mg by mouth daily.   Yes [provider]  feeding supplement (BOOST HIGH PROTEIN) LIQD Take 1 Container by mouth 2 (two) times daily between meals.   Yes [provider]  HYDROcodone-acetaminophen (NORCO/VICODIN) 5-325 MG tablet Take 1 tablet by mouth 3 (three) times daily.   Yes [provider]  loperamide (IMODIUM A-D) 2 MG tablet Take 2 mg by mouth 4 (four) times daily as needed for diarrhea or loose stools.   Yes [provider]  LORazepam (ATIVAN) 0.5 MG tablet Take 0.5 mg by mouth every 6 (six) hours as needed for anxiety.   Yes [provider]  meloxicam (MOBIC) 7.5 MG tablet TAKE ONE TABLET TWICE DAILY Patient taking differently: take 7.5mg  by mouth twice daily 09/05/16  Yes Nafziger, Tommi Rumps, NP  OXYGEN Inhale 2 L into the lungs daily as needed (SOB).   Yes [provider]  senna (SENOKOT) 8.6 MG TABS tablet Take 1 tablet by mouth daily as needed for mild constipation.   Yes [provider]  traMADol (ULTRAM) 50 MG tablet TAKE ONE TABLET  AT BEDTIME OR AS NEEDED Patient taking differently: take 50mg  by mouth at bedtime 09/05/16  Yes Nafziger, Tommi Rumps, NP  mirtazapine (REMERON) 15 MG tablet Take 0.5 tablets (7.5 mg total) by mouth at bedtime. Patient not taking: Reported on 11/17/2016 07/06/16   Dorothyann Peng, NP  Propylene Glycol 0.95 % SOLN Place 1 drop into both eyes 2 (two) times daily as needed. Patient not taking: Reported on 11/17/2016 07/12/15   Dorothyann Peng, NP    Family History Family History  Problem Relation Age of Onset  . Hypertension Mother   . Pneumonia Mother   . Stroke Father   . Parkinsonism Father     Social History Social History  Substance Use Topics  .  Smoking status: Never Smoker  . Smokeless tobacco: Never Used  . Alcohol use No     Allergies   Iodinated diagnostic agents   Review of Systems Review of Systems  Unable to perform ROS: Age     Physical Exam Updated Vital Signs BP (!) 160/88   Pulse 93   Temp 97.7 F (36.5 C) (Oral)   Resp 17   SpO2 94%   Physical Exam  Constitutional: He appears well-developed and well-nourished. No distress.  HOH  HENT:  Head: Normocephalic and atraumatic.  Mouth/Throat: Oropharynx is clear and moist.  Eyes: Pupils are equal, round, and reactive to light. Conjunctivae and EOM are normal.  Neck: Normal range of motion.  Cardiovascular: Normal rate, regular rhythm and intact distal pulses.   Pulmonary/Chest: Effort normal and breath sounds normal. No respiratory distress. He has no wheezes. He has no rales. He exhibits no tenderness.  Abdominal: Soft. He exhibits no distension and no mass. There is no tenderness. There is no rebound and no guarding. No hernia.  Suprapubic catheter in place, it is flowing with bloody urine, no clots.   Musculoskeletal: Normal range of motion.  Neurological: He is alert.  Skin: He is not diaphoretic.  Psychiatric: He has a normal mood and affect.  Nursing note and vitals reviewed.    ED Treatments / Results  Labs (all labs ordered are listed, but only abnormal results are displayed) Labs Reviewed  CBC WITH DIFFERENTIAL/PLATELET - Abnormal; Notable for the following:       Result Value   WBC 10.8 (*)    RBC 3.68 (*)    Hemoglobin 11.0 (*)    HCT 33.5 (*)    RDW 15.6 (*)    Neutro Abs 8.2 (*)    All other components within normal limits  COMPREHENSIVE METABOLIC PANEL - Abnormal; Notable for the following:    Glucose, Bld 118 (*)    BUN 44 (*)    Creatinine, Ser 1.39 (*)    Albumin 3.0 (*)    ALT 9 (*)    GFR calc non Af Amer 41 (*)    GFR calc Af Amer 47 (*)    All other components within normal limits  PROTIME-INR - Abnormal; Notable  for the following:    Prothrombin Time 15.5 (*)    All other components within normal limits  POC OCCULT BLOOD, ED - Abnormal; Notable for the following:    Fecal Occult Bld POSITIVE (*)    All other components within normal limits  APTT  SAMPLE TO BLOOD BANK    EKG  EKG Interpretation None       Radiology No results found.  Procedures Procedures (including critical care time)  Medications Ordered in ED Medications  lidocaine (XYLOCAINE) 2 %  jelly 1 application (1 application Topical Given 11/18/16 0011)     Initial Impression / Assessment and Plan / ED Course  I have reviewed the triage vital signs and the nursing notes.  Pertinent labs & imaging results that were available during my care of the patient were reviewed by me and considered in my Velasquez decision making (see chart for details).     Vitals:   11/17/16 2254 11/17/16 2258 11/18/16 0108  BP: (!) 170/101  (!) 160/88  Pulse: (!) 102  93  Resp: 19  17  Temp: 97.7 F (36.5 C)    TempSrc: Oral    SpO2: 90% 96% 94%    Medications  lidocaine (XYLOCAINE) 2 % jelly 1 application (1 application Topical Given 11/18/16 0011)    Mark Velasquez is 83 y.o. male presenting with rectal bleeding, he has several large clots, current jelly like stool.  Possible diverticular bleed.  This patient is from hospice, DNR and DNI, confirmed with hospice nurse Mark Velasquez.  She states that son Mark Velasquez asked him to to come to the emergency room to make sure everything was "okay."  As per patient work and confirmed by patient Mark Velasquez is Velasquez power of attorney to him via phone, he confirms that this patient is DNR and DNI, he understands that lower GI bleed is potentially lethal, interventions are limited and may consist of intubation and CPR.  He states that his father would not want that.  Mark Velasquez POA confirms DNI, DNR, and "do what you need to do to take care of him" Mark Velasquez conversation about Lower GI bleed being potentially  lethal.  Mark Velasquez confirms that he does not want CPR or intubation but he would agree to a blood transfusion and possible colonoscopy.   Mark Velasquez (Velasquez POA) Cell: [phone] Home: [phone]   Discussed with [facility] hospitalist who accepts admission.     Gastroenterology will need to be consulted in the a.m.    Final Clinical Impressions(s) / ED Diagnoses   Final diagnoses:  Gastrointestinal hemorrhage, unspecified gastrointestinal hemorrhage type    New Prescriptions New Prescriptions   No medications on file     Waynetta Pean 11/18/16 0119    Molpus, Jenny Reichmann, MD 11/18/16 [phone]

## 2016-11-18 ENCOUNTER — Inpatient Hospital Stay (HOSPITAL_COMMUNITY)

## 2016-11-18 DIAGNOSIS — E785 Hyperlipidemia, unspecified: Secondary | ICD-10-CM | POA: Diagnosis present

## 2016-11-18 DIAGNOSIS — K922 Gastrointestinal hemorrhage, unspecified: Secondary | ICD-10-CM | POA: Diagnosis not present

## 2016-11-18 DIAGNOSIS — K91841 Postprocedural hemorrhage and hematoma of a digestive system organ or structure following other procedure: Secondary | ICD-10-CM | POA: Diagnosis present

## 2016-11-18 DIAGNOSIS — F0390 Unspecified dementia without behavioral disturbance: Secondary | ICD-10-CM | POA: Diagnosis present

## 2016-11-18 DIAGNOSIS — M109 Gout, unspecified: Secondary | ICD-10-CM | POA: Diagnosis not present

## 2016-11-18 DIAGNOSIS — R627 Adult failure to thrive: Secondary | ICD-10-CM | POA: Diagnosis present

## 2016-11-18 DIAGNOSIS — Z8546 Personal history of malignant neoplasm of prostate: Secondary | ICD-10-CM | POA: Diagnosis not present

## 2016-11-18 DIAGNOSIS — E46 Unspecified protein-calorie malnutrition: Secondary | ICD-10-CM | POA: Diagnosis not present

## 2016-11-18 DIAGNOSIS — N184 Chronic kidney disease, stage 4 (severe): Secondary | ICD-10-CM | POA: Diagnosis not present

## 2016-11-18 DIAGNOSIS — B9689 Other specified bacterial agents as the cause of diseases classified elsewhere: Secondary | ICD-10-CM | POA: Diagnosis present

## 2016-11-18 DIAGNOSIS — E43 Unspecified severe protein-calorie malnutrition: Secondary | ICD-10-CM | POA: Diagnosis not present

## 2016-11-18 DIAGNOSIS — H11149 Conjunctival xerosis, unspecified, unspecified eye: Secondary | ICD-10-CM | POA: Diagnosis not present

## 2016-11-18 DIAGNOSIS — I129 Hypertensive chronic kidney disease with stage 1 through stage 4 chronic kidney disease, or unspecified chronic kidney disease: Secondary | ICD-10-CM | POA: Diagnosis present

## 2016-11-18 DIAGNOSIS — I4891 Unspecified atrial fibrillation: Secondary | ICD-10-CM | POA: Diagnosis not present

## 2016-11-18 DIAGNOSIS — I480 Paroxysmal atrial fibrillation: Secondary | ICD-10-CM | POA: Diagnosis present

## 2016-11-18 DIAGNOSIS — B965 Pseudomonas (aeruginosa) (mallei) (pseudomallei) as the cause of diseases classified elsewhere: Secondary | ICD-10-CM | POA: Diagnosis present

## 2016-11-18 DIAGNOSIS — N179 Acute kidney failure, unspecified: Secondary | ICD-10-CM | POA: Diagnosis not present

## 2016-11-18 DIAGNOSIS — B961 Klebsiella pneumoniae [K. pneumoniae] as the cause of diseases classified elsewhere: Secondary | ICD-10-CM | POA: Diagnosis present

## 2016-11-18 DIAGNOSIS — Z515 Encounter for palliative care: Secondary | ICD-10-CM | POA: Diagnosis not present

## 2016-11-18 DIAGNOSIS — N390 Urinary tract infection, site not specified: Secondary | ICD-10-CM | POA: Diagnosis not present

## 2016-11-18 DIAGNOSIS — Z66 Do not resuscitate: Secondary | ICD-10-CM | POA: Diagnosis present

## 2016-11-18 DIAGNOSIS — K631 Perforation of intestine (nontraumatic): Secondary | ICD-10-CM | POA: Diagnosis not present

## 2016-11-18 DIAGNOSIS — R410 Disorientation, unspecified: Secondary | ICD-10-CM | POA: Diagnosis present

## 2016-11-18 DIAGNOSIS — R54 Age-related physical debility: Secondary | ICD-10-CM | POA: Diagnosis not present

## 2016-11-18 DIAGNOSIS — D72829 Elevated white blood cell count, unspecified: Secondary | ICD-10-CM | POA: Diagnosis present

## 2016-11-18 DIAGNOSIS — G3184 Mild cognitive impairment, so stated: Secondary | ICD-10-CM | POA: Diagnosis not present

## 2016-11-18 DIAGNOSIS — Z6821 Body mass index (BMI) 21.0-21.9, adult: Secondary | ICD-10-CM | POA: Diagnosis not present

## 2016-11-18 DIAGNOSIS — H9190 Unspecified hearing loss, unspecified ear: Secondary | ICD-10-CM | POA: Diagnosis present

## 2016-11-18 DIAGNOSIS — Z79899 Other long term (current) drug therapy: Secondary | ICD-10-CM | POA: Diagnosis not present

## 2016-11-18 DIAGNOSIS — N320 Bladder-neck obstruction: Secondary | ICD-10-CM | POA: Diagnosis not present

## 2016-11-18 DIAGNOSIS — D62 Acute posthemorrhagic anemia: Secondary | ICD-10-CM | POA: Diagnosis not present

## 2016-11-18 DIAGNOSIS — I10 Essential (primary) hypertension: Secondary | ICD-10-CM | POA: Diagnosis not present

## 2016-11-18 DIAGNOSIS — Y838 Other surgical procedures as the cause of abnormal reaction of the patient, or of later complication, without mention of misadventure at the time of the procedure: Secondary | ICD-10-CM | POA: Diagnosis present

## 2016-11-18 LAB — CBC
HCT: 29.4 % — ABNORMAL LOW (ref 39.0–52.0)
HCT: 29.5 % — ABNORMAL LOW (ref 39.0–52.0)
HCT: 27.5 % — ABNORMAL LOW (ref 39.0–52.0)
HCT: 23.4 % — ABNORMAL LOW (ref 39.0–52.0)
HEMOGLOBIN: 9.5 g/dL — AB (ref 13.0–17.0)
HEMOGLOBIN: 7.7 g/dL — AB (ref 13.0–17.0)
Hemoglobin: 9.6 g/dL — ABNORMAL LOW (ref 13.0–17.0)
Hemoglobin: 9 g/dL — ABNORMAL LOW (ref 13.0–17.0)
MCH: 29.4 pg (ref 26.0–34.0)
MCH: 29.7 pg (ref 26.0–34.0)
MCH: 30.2 pg (ref 26.0–34.0)
MCH: 30.4 pg (ref 26.0–34.0)
MCHC: 32.3 g/dL (ref 30.0–36.0)
MCHC: 32.5 g/dL (ref 30.0–36.0)
MCHC: 32.7 g/dL (ref 30.0–36.0)
MCHC: 32.9 g/dL (ref 30.0–36.0)
MCV: 91 fL (ref 78.0–100.0)
MCV: 91.3 fL (ref 78.0–100.0)
MCV: 92.3 fL (ref 78.0–100.0)
MCV: 92.5 fL (ref 78.0–100.0)
PLATELETS: 214 10*3/uL (ref 150–400)
PLATELETS: 224 10*3/uL (ref 150–400)
PLATELETS: 239 10*3/uL (ref 150–400)
PLATELETS: 199 10*3/uL (ref 150–400)
RBC: 3.23 MIL/uL — AB (ref 4.22–5.81)
RBC: 3.23 MIL/uL — ABNORMAL LOW (ref 4.22–5.81)
RBC: 2.98 MIL/uL — ABNORMAL LOW (ref 4.22–5.81)
RBC: 2.53 MIL/uL — AB (ref 4.22–5.81)
RDW: 15.4 % (ref 11.5–15.5)
RDW: 15.6 % — ABNORMAL HIGH (ref 11.5–15.5)
RDW: 15.7 % — AB (ref 11.5–15.5)
RDW: 15.7 % — ABNORMAL HIGH (ref 11.5–15.5)
WBC: 8.1 10*3/uL (ref 4.0–10.5)
WBC: 8.8 10*3/uL (ref 4.0–10.5)
WBC: 11.5 10*3/uL — AB (ref 4.0–10.5)
WBC: 12 10*3/uL — ABNORMAL HIGH (ref 4.0–10.5)

## 2016-11-18 LAB — COMPREHENSIVE METABOLIC PANEL
ALK PHOS: 79 U/L (ref 38–126)
ALT: 9 U/L — AB (ref 17–63)
AST: 23 U/L (ref 15–41)
Albumin: 3 g/dL — ABNORMAL LOW (ref 3.5–5.0)
Anion gap: 11 (ref 5–15)
BUN: 44 mg/dL — AB (ref 6–20)
CALCIUM: 8.9 mg/dL (ref 8.9–10.3)
CO2: 25 mmol/L (ref 22–32)
CREATININE: 1.39 mg/dL — AB (ref 0.61–1.24)
Chloride: 106 mmol/L (ref 101–111)
GFR calc non Af Amer: 41 mL/min — ABNORMAL LOW (ref >60)
GFR, EST AFRICAN AMERICAN: 47 mL/min — AB (ref >60)
GLUCOSE: 118 mg/dL — AB (ref 65–99)
Potassium: 4.7 mmol/L (ref 3.5–5.1)
SODIUM: 142 mmol/L (ref 135–145)
Total Bilirubin: 0.8 mg/dL (ref 0.3–1.2)
Total Protein: 7 g/dL (ref 6.5–8.1)

## 2016-11-18 LAB — BASIC METABOLIC PANEL
ANION GAP: 6 (ref 5–15)
BUN: 46 mg/dL — ABNORMAL HIGH (ref 6–20)
CALCIUM: 8.4 mg/dL — AB (ref 8.9–10.3)
CO2: 26 mmol/L (ref 22–32)
CREATININE: 1.3 mg/dL — AB (ref 0.61–1.24)
Chloride: 111 mmol/L (ref 101–111)
GFR, EST AFRICAN AMERICAN: 51 mL/min — AB (ref >60)
GFR, EST NON AFRICAN AMERICAN: 44 mL/min — AB (ref >60)
GLUCOSE: 113 mg/dL — AB (ref 65–99)
Potassium: 4.3 mmol/L (ref 3.5–5.1)
Sodium: 143 mmol/L (ref 135–145)

## 2016-11-18 LAB — MRSA PCR SCREENING: MRSA by PCR: NEGATIVE

## 2016-11-18 MED ORDER — SODIUM CHLORIDE 0.9 % IV BOLUS (SEPSIS)
500.0000 mL | Freq: Once | INTRAVENOUS | Status: AC
  Administered 2016-11-18: 500 mL via INTRAVENOUS

## 2016-11-18 MED ORDER — SODIUM CHLORIDE 0.9 % IV SOLN
INTRAVENOUS | Status: DC
  Administered 2016-11-18 – 2016-11-21 (×3): via INTRAVENOUS

## 2016-11-18 MED ORDER — LORAZEPAM 0.5 MG PO TABS
0.5000 mg | ORAL_TABLET | Freq: Four times a day (QID) | ORAL | Status: DC | PRN
  Filled 2016-11-18: qty 1

## 2016-11-18 MED ORDER — ONDANSETRON HCL 4 MG/2ML IJ SOLN
4.0000 mg | Freq: Four times a day (QID) | INTRAMUSCULAR | Status: DC | PRN
  Administered 2016-11-19: 4 mg via INTRAVENOUS
  Filled 2016-11-18: qty 2

## 2016-11-18 MED ORDER — ACETAMINOPHEN 325 MG PO TABS: 650.0000 mg | ORAL_TABLET | Freq: Four times a day (QID) | ORAL | Status: DC | PRN

## 2016-11-18 MED ORDER — ACETAMINOPHEN 650 MG RE SUPP: 650.0000 mg | Freq: Four times a day (QID) | RECTAL | Status: DC | PRN

## 2016-11-18 MED ORDER — ACT DRY MOUTH MT LOZG: 1.0000 | LOZENGE | Freq: Every day | OROMUCOSAL | Status: DC

## 2016-11-18 MED ORDER — PANTOPRAZOLE SODIUM 40 MG IV SOLR
40.0000 mg | Freq: Two times a day (BID) | INTRAVENOUS | Status: DC
  Administered 2016-11-18 – 2016-11-21 (×8): 40 mg via INTRAVENOUS
  Filled 2016-11-18 (×8): qty 40

## 2016-11-18 MED ORDER — ONDANSETRON HCL 4 MG PO TABS: 4.0000 mg | ORAL_TABLET | Freq: Four times a day (QID) | ORAL | Status: DC | PRN

## 2016-11-18 MED ORDER — ALBUTEROL SULFATE (2.5 MG/3ML) 0.083% IN NEBU: 2.5000 mg | INHALATION_SOLUTION | Freq: Four times a day (QID) | RESPIRATORY_TRACT | Status: DC | PRN

## 2016-11-18 MED ORDER — HYDROMORPHONE HCL 1 MG/ML IJ SOLN
1.0000 mg | INTRAMUSCULAR | Status: DC | PRN
  Administered 2016-11-18 – 2016-11-19 (×2): 1 mg via INTRAVENOUS
  Filled 2016-11-18 (×2): qty 1

## 2016-11-18 MED ORDER — BIOTENE DRY MOUTH MT LIQD
15.0000 mL | Freq: Three times a day (TID) | OROMUCOSAL | Status: DC
  Administered 2016-11-19 – 2016-11-24 (×15): 15 mL via OROMUCOSAL

## 2016-11-18 MED ORDER — TRAMADOL HCL 50 MG PO TABS
50.0000 mg | ORAL_TABLET | Freq: Every day | ORAL | Status: DC
  Administered 2016-11-18: 50 mg via ORAL
  Filled 2016-11-18 (×2): qty 1

## 2016-11-18 MED ORDER — HYDROCODONE-ACETAMINOPHEN 5-325 MG PO TABS
1.0000 | ORAL_TABLET | Freq: Three times a day (TID) | ORAL | Status: DC
  Administered 2016-11-18 – 2016-11-19 (×4): 1 via ORAL
  Filled 2016-11-18 (×5): qty 1

## 2016-11-18 MED ORDER — IPRATROPIUM BROMIDE 0.02 % IN SOLN: 0.5000 mg | Freq: Four times a day (QID) | RESPIRATORY_TRACT | Status: DC | PRN

## 2016-11-18 MED ORDER — MENTHOL 3 MG MT LOZG
1.0000 | LOZENGE | Freq: Every day | OROMUCOSAL | Status: DC
  Administered 2016-11-18: 3 mg via ORAL
  Filled 2016-11-18: qty 9

## 2016-11-18 MED ORDER — MORPHINE SULFATE (PF) 4 MG/ML IV SOLN
1.0000 mg | INTRAVENOUS | Status: DC | PRN
  Administered 2016-11-18: 1 mg via INTRAVENOUS
  Filled 2016-11-18: qty 1

## 2016-11-18 MED ORDER — SODIUM CHLORIDE 0.9 % IV SOLN: Freq: Once | INTRAVENOUS | Status: DC

## 2016-11-18 MED ORDER — PIPERACILLIN-TAZOBACTAM 3.375 G IVPB
3.3750 g | Freq: Three times a day (TID) | INTRAVENOUS | Status: DC
  Administered 2016-11-19 – 2016-11-21 (×8): 3.375 g via INTRAVENOUS
  Filled 2016-11-18 (×8): qty 50

## 2016-11-18 NOTE — Significant Event (Signed)
Rapid Response Event Note  Overview: Time Called: [place] Time: 1645 Event Type: Other (Comment) (GI bleed)  Initial Focused Assessment:  Rapid response RN called to bedside by RN after patient had copious red stool on bedside commode and had a dusky skin color.. Patient pale and weak upon entering room. Vital signs stable upon arrival. Patient and floor had large amounts of bloody stool. Patient cleaned up and fluids started. MD discussed with patients family and the decision was made to make the patient conservative care, as patient had refused surgery. Patients color better after lying down. Vital signs remained stable. MD and family at bedside. Type and screen ordered for blood transfusion, lab called to complete. Bedside RN to continue to monitor. Alec Jaros A Daijon Wenke

## 2016-11-18 NOTE — Progress Notes (Signed)
Rn may call report to Riva at [phone] @ 0130.

## 2016-11-18 NOTE — ED Notes (Signed)
Patient incontinent bloody brown stool-cleaned and repositioned in bed-warm blankets applied

## 2016-11-18 NOTE — Progress Notes (Signed)
Patient bleeding from rectum this morning and experiencing pain level of 10.  Given Hydrocodone PO with no relief in pain.  MD ordered Dilaudid to be given.  Patient asleep after medications given. Son at bedside. Will continue to monitor.

## 2016-11-18 NOTE — Progress Notes (Signed)
Pharmacy Antibiotic Note  Mark Velasquez is a 83 y.o. male admitted on 11/17/2016 with rectal bleeding.  Pharmacy has been consulted for zosyn dosing.  Plan: Zosyn 3.375g IV Q8H infused over 4hrs. Follow renal function    Height: 5\' 10"  (177.8 cm) (approximate weight) Weight: 146 lb 9.7 oz (66.5 kg) IBW/kg (Calculated) : 73  Temp (24hrs), Avg:97.8 F (36.6 C), Min:97.5 F (36.4 C), Max:98.2 F (36.8 C)   Recent Labs Lab 11/17/16 2307 11/18/16 0312 11/18/16 0615 11/18/16 1007 11/18/16 1423  WBC 10.8* 8.1 8.8 11.5* 12.0*  CREATININE 1.39* 1.30*  --   --   --     Estimated Creatinine Clearance: 29.8 mL/min (A) (by C-G formula based on SCr of 1.3 mg/dL (H)).    Allergies  Allergen Reactions  . Iodinated Diagnostic Agents Nausea And Vomiting     Thank you for allowing pharmacy to be a part of this patient's care.  Dolly Rias RPh 11/18/2016, 5:13 PM Pager [phone]

## 2016-11-18 NOTE — Progress Notes (Signed)
RN swabbed the patient's nares and will send specimen to check for MRSA by PCR.

## 2016-11-18 NOTE — Progress Notes (Signed)
Pt seen and examined at bedside, Pt admitted after midnight, please see earlier admission note by Dr. Ara Kussmaul. Pt admitted with acute blood loss anemia, rectal bleed, CT imaging studies notable for rectal perforation. Pt very clear he does not want colonoscopy and after discussion with surgery team on call, pt and his son both clear that conservative management is preferred as clearly no curative options available. Will provide transfusion, IVF for supportive care, analgesia as needed for adequate pain control. PCT consultation requested as well. CBC and BMP in AM.  Faye Ramsay, MD  [facility] Pager [phone]  If 7PM-7AM, please contact night-coverage [email or web address] Password [email or web address]

## 2016-11-18 NOTE — Progress Notes (Signed)
Pt up to [facility] bm. While transferring from bed to [facility] bright red blood coming from rectum. Pt with large amount blood in BSC and floor. Md at bedside to assess pt. Bleeding continues. Pt and family express that they do not want aggressive treatment. MD updated. MD spoke with son. VS charted and MD updated. Verbal orders given for morphine 1-2 mg Q4H prn for severe pain.

## 2016-11-18 NOTE — Progress Notes (Signed)
PT Cancellation Note  Patient Details Name: Mark Velasquez MRN: [id number] DOB: 1916-09-28   Cancelled Treatment:    Reason Eval/Treat Not Completed: Medical issues which prohibited therapy (pt actively bleeding from rectum, hold PT per RN request. Will follow. )   Philomena Doheny 11/18/2016, 12:29 PM  [phone]

## 2016-11-18 NOTE — Progress Notes (Signed)
Pt up to [facility]. Pt with copious amount of red liquid stool draining from rectum. Moved pt to bed. Pt lying down on bed, face turning blue. Pt verbal and able to answer questions. VS charted. Called RRT. RR RN at bedside to assess pt. Called MD. MD spoke with pt son ,HCPOA, and put in new orders. Cleaned pt. Administered 500CC bolus. Tolerated well. Pt in bed resting with family at bedside

## 2016-11-18 NOTE — Progress Notes (Signed)
[facility] - [facility] - GIP RN visit at 0840am  This is a related and covered GIP admission of 11/18/16 with [facility] diagnosis of Protein Calorie Malnutrition, per Dr. Tomasa Hosteller.  Patient has [facility] DNR.  EMS initiated by [facility], after it was noticed that patient was bleeding from rectum.  Patient had pulled out his catheter earlier, but it had been replaced.  [facility] RN was notified and went to facility.  Visited in patient room this morning.  Patient was asleep and did not awaken to my voice.  Patient was NAD.  Patient was resting comfortably.  No family members or visitors were in the room.  Per MD notes, family is consistent with wanting limited interventions and wanted to keep patient DNR/DNI.   Patient receiving:  Pantoprazole (PROTONIX) injection 40 mg, Dose 40 mg, Q12H via IV.  Continuous medication:  0.9% sodium chloride infusion, Rate 50 mL/hr, Continuous via IV.  PRN medications:  None given at this time.   Transfer summary and medication list were placed on shadow chart.  Dr. Tomasa Hosteller and Dr. Norva Karvonen were notified of patient admission to [facility].  We will continue to monitor patient and anticipate any discharge needs.   If you have any questions, please feel free to contact me.  Thank you.  Edyth Gunnels, RN, BSN [facility] Liaison [phone]  All hospital liaisons are now on [place].

## 2016-11-18 NOTE — Progress Notes (Signed)
Initial Nutrition Assessment  INTERVENTION:   Diet advancement per MD  NUTRITION DIAGNOSIS:   Inadequate oral intake related to inability to eat as evidenced by NPO status.  GOAL:   Patient will meet greater than or equal to 90% of their needs  MONITOR:   Diet advancement, Labs, Weight trends, I & O's  REASON FOR ASSESSMENT:   Malnutrition Screening Tool    ASSESSMENT:   83 y.o. male with a known history of BPH, CKD4, afib, HLD, HTN presents to the emergency department for evaluation of rectal bleeding.  Patient is currently being cared for by [facility] but was sent into the emergency department by his son for evaluation of bright red blood per rectum..  Pt actively bleeding via rectum. Pt NPO ,awaiting GI consult. Per chart, pt drinks Boost High Protein at home. Will order supplements once diet is advanced. Pt was followed by hospice PTA.  Will monitor for plan.  Labs reviewed. Medications reviewed.  NUTRITION - FOCUSED PHYSICAL EXAM:  Unable to perform NFPE.  Diet Order:  Diet NPO time specified  EDUCATION NEEDS:   Not appropriate for education at this time  Skin:  Skin Assessment: Reviewed RN Assessment  Last BM:  10/28 -bleeding  Height:   Ht Readings from Last 1 Encounters:  11/18/16 5\' 10"  (1.778 m)    Weight:   Wt Readings from Last 1 Encounters:  11/18/16 146 lb 9.7 oz (66.5 kg)    Ideal Body Weight:  75.5 kg  BMI:  Body mass index is 21.04 kg/m.  Estimated Nutritional Needs:   Kcal:  1400-1600  Protein:  65-75g  Fluid:  1.6L/day  Clayton Bibles, MS, RD, LDN [facility] Dietitian Pager: [phone] After Hours Pager: [phone]

## 2016-11-18 NOTE — H&P (Addendum)
History and Physical   [facility] Admission History and Physical [employer], D.O.    Patient Name: Mark Velasquez MR#: [id number] Date of Birth: 1916-10-16 Date of Admission: 11/17/2016  Referring MD/NP/PA: Monico Blitz Primary Care Physician: Dorothyann Peng, NP  Chief Complaint:  Chief Complaint  Patient presents with  . Rectal Bleeding  Please note the entire history is obtained from the patient's emergency department chart, emergency department provider. Patient's personal history is limited by hard of hearing, confusion.   HPI: Mark Velasquez is a 83 y.o. male with a known history of BPH, CKD4, afib, HLD, HTN presents to the emergency department for evaluation of rectal bleeding.  Patient is currently being cared for by [name] but was sent into the emergency department by his son for evaluation of bright red blood per rectum..  Patient denies fevers/chills, weakness, dizziness, chest pain, shortness of breath, N/V/C/D, abdominal pain, dysuria/frequency, changes in mental status.   EMS/ED Course: Patient is hemodynamically stable without any ongoing bleeding while here in the ED. Medical admission has been requested for further management of lower GI bleeding.  He is DNR/DNI confirmed by EDP after speaking with son who is HCPOA.   Review of Systems: Note patient is an unreliable historian due to confusion.  CONSTITUTIONAL: No fever/chills, fatigue, weakness, weight gain/loss, headache. EYES: No blurry or double vision. ENT: No tinnitus, postnasal drip, redness or soreness of the oropharynx. RESPIRATORY: No cough, dyspnea, wheeze.  No hemoptysis.  CARDIOVASCULAR: No chest pain, palpitations, syncope, orthopnea. No lower extremity edema.  GASTROINTESTINAL: No nausea, vomiting, abdominal pain, diarrhea, constipation.  No hematemesis, melena. Positive hematochezia. GENITOURINARY: No dysuria, frequency,  hematuria. ENDOCRINE: No polyuria or nocturia. No heat or cold intolerance. HEMATOLOGY: No anemia, bruising, bleeding. INTEGUMENTARY: No rashes, ulcers, lesions. MUSCULOSKELETAL: No arthritis, gout, dyspnea. NEUROLOGIC: No numbness, tingling, ataxia, seizure-type activity, weakness. PSYCHIATRIC: No anxiety, depression, insomnia.   Past Medical History:  Diagnosis Date  . BPH (benign prostatic hyperplasia)   . Chronic kidney disease    renal failure  . Dyslipidemia   . Femur fracture ([place])   . Gout   . Hearing loss   . Hypertension   . Prostate cancer ([place]) 1999  PAF  Past Surgical History:  Procedure Laterality Date  . APPENDECTOMY    . CYSTOSCOPY     bladder neck contracture  . ORIF HIP FRACTURE     right  . PROSTATECTOMY    . TONSILLECTOMY    . VERTEBROPLASTY       reports that he has never smoked. He has never used smokeless tobacco. He reports that he does not drink alcohol or use drugs.  Allergies  Allergen Reactions  . Iodinated Diagnostic Agents Nausea And Vomiting    Family History  Problem Relation Age of Onset  . Hypertension Mother   . Pneumonia Mother   . Stroke Father   . Parkinsonism Father     Prior to Admission medications   Medication Sig Start Date End Date Taking? Authorizing Provider  antiseptic oral rinse (BIOTENE) LIQD 15 mLs by Mouth Rinse route 3 (three) times daily after meals.   Yes [provider]  Artificial Saliva (ACT DRY MOUTH) LOZG Use as directed 1 lozenge in the mouth or throat at bedtime.   Yes [provider]  aspirin EC 81 MG tablet Take 81 mg by mouth daily.   Yes [provider]  feeding supplement (BOOST HIGH PROTEIN) LIQD Take 1  Container by mouth 2 (two) times daily between meals.   Yes [provider]  HYDROcodone-acetaminophen (NORCO/VICODIN) 5-325 MG tablet Take 1 tablet by mouth 3 (three) times daily.   Yes [provider]  loperamide (IMODIUM A-D) 2 MG tablet Take 2 mg  by mouth 4 (four) times daily as needed for diarrhea or loose stools.   Yes [provider]  LORazepam (ATIVAN) 0.5 MG tablet Take 0.5 mg by mouth every 6 (six) hours as needed for anxiety.   Yes [provider]  meloxicam (MOBIC) 7.5 MG tablet TAKE ONE TABLET TWICE DAILY Patient taking differently: take 7.5mg  by mouth twice daily 09/05/16  Yes Nafziger, Tommi Rumps, NP  OXYGEN Inhale 2 L into the lungs daily as needed (SOB).   Yes [provider]  senna (SENOKOT) 8.6 MG TABS tablet Take 1 tablet by mouth daily as needed for mild constipation.   Yes [provider]  traMADol (ULTRAM) 50 MG tablet TAKE ONE TABLET AT BEDTIME OR AS NEEDED Patient taking differently: take 50mg  by mouth at bedtime 09/05/16  Yes Nafziger, Tommi Rumps, NP  mirtazapine (REMERON) 15 MG tablet Take 0.5 tablets (7.5 mg total) by mouth at bedtime. Patient not taking: Reported on 11/17/2016 07/06/16   Dorothyann Peng, NP  Propylene Glycol 0.95 % SOLN Place 1 drop into both eyes 2 (two) times daily as needed. Patient not taking: Reported on 11/17/2016 07/12/15   Dorothyann Peng, NP    Physical Exam: Vitals:   11/17/16 2254 11/17/16 2258  BP: (!) 170/101   Pulse: (!) 102   Resp: 19   Temp: 97.7 F (36.5 C)   TempSrc: Oral   SpO2: 90% 96%    GENERAL: 83 y.o.-year-old male patient, pale, lying in the bed in no acute distress.  Mildly confused, asking where he is and what time it is, repeatedly.  HEENT: Head atraumatic, normocephalic. Pupils equal. Mucus membranes very dry. NECK: Supple, full range of motion. No JVD, no bruit heard. No thyroid enlargement, no tenderness, no cervical lymphadenopathy. CHEST: Normal breath sounds bilaterally. No wheezing, rales, rhonchi or crackles. No use of accessory muscles of respiration.  No reproducible chest wall tenderness.  CARDIOVASCULAR: S1, S2 normal.. Cap refill <2 seconds. Pulses intact distally.  ABDOMEN: Soft, nondistended, nontender. No rebound, guarding,  rigidity. Normoactive bowel sounds present in all four quadrants.  EXTREMITIES: No pedal edema, cyanosis, or clubbing. No calf tenderness or Homan's sign.  NEUROLOGIC: The patient is alert and oriented x 2. Follows commands. PSYCHIATRIC:  Normal affect, mood, thought content. SKIN: Warm, dry, and intact without obvious rash, lesion, or ulcer.    Labs on Admission:  CBC:  Recent Labs Lab 11/17/16 2307  WBC 10.8*  NEUTROABS 8.2*  HGB 11.0*  HCT 33.5*  MCV 91.0  PLT 865   Basic Metabolic Panel:  Recent Labs Lab 11/17/16 2307  NA 142  K 4.7  CL 106  CO2 25  GLUCOSE 118*  BUN 44*  CREATININE 1.39*  CALCIUM 8.9   GFR: CrCl cannot be calculated (Unknown ideal weight.). Liver Function Tests:  Recent Labs Lab 11/17/16 2307  AST 23  ALT 9*  ALKPHOS 79  BILITOT 0.8  PROT 7.0  ALBUMIN 3.0*   No results for input(s): LIPASE, AMYLASE in the last 168 hours. No results for input(s): AMMONIA in the last 168 hours. Coagulation Profile:  Recent Labs Lab 11/17/16 2307  INR 1.24   Cardiac Enzymes: No results for input(s): CKTOTAL, CKMB, CKMBINDEX, TROPONINI in the last 168 hours.  BNP (last 3 results) No results for input(s): PROBNP in the last 8760 hours. HbA1C: No results for input(s): HGBA1C in the last 72 hours. CBG: No results for input(s): GLUCAP in the last 168 hours. Lipid Profile: No results for input(s): CHOL, HDL, LDLCALC, TRIG, CHOLHDL, LDLDIRECT in the last 72 hours. Thyroid Function Tests: No results for input(s): TSH, T4TOTAL, FREET4, T3FREE, THYROIDAB in the last 72 hours. Anemia Panel: No results for input(s): VITAMINB12, FOLATE, FERRITIN, TIBC, IRON, RETICCTPCT in the last 72 hours. Urine analysis:    Component Value Date/Time   COLORURINE YELLOW 08/27/2016 1149   APPEARANCEUR CLOUDY (A) 08/27/2016 1149   LABSPEC 1.014 08/27/2016 1149   PHURINE 5.0 08/27/2016 1149   GLUCOSEU NEGATIVE 08/27/2016 1149   HGBUR SMALL (A) 08/27/2016 1149   HGBUR  negative 02/10/2010 0814   BILIRUBINUR NEGATIVE 08/27/2016 1149   BILIRUBINUR n 02/05/2013 1535   KETONESUR NEGATIVE 08/27/2016 1149   PROTEINUR NEGATIVE 08/27/2016 1149   UROBILINOGEN 0.2 02/05/2013 1535   UROBILINOGEN 0.2 02/10/2010 0814   NITRITE POSITIVE (A) 08/27/2016 1149   LEUKOCYTESUR SMALL (A) 08/27/2016 1149   Sepsis Labs: @LABRCNTIP (procalcitonin:4,lacticidven:4) )No results found for this or any previous visit (from the past 240 hour(s)).   Radiological Exams on Admission: No results found.  Assessment/Plan  This is a 83 y.o. male with a history of BPH, CKD, HLD, HTN now being admitted with:  #. Lower GI Bleed - Admit inpatient -IV Protonix 40mg  BID -Serial CBCs -Nothing by mouth -IV fluid hydration -Hold anticoagulants: aspirin, mobic -GI consultation has been requested by ED PA  Patient is being followed by [facility] for failure to thrive, protein calorie malnutrition.   Admission status: Inpatient IV Fluids: NS Diet/Nutrition: NPO Consults called: GI  DVT Px:  SCDs and early ambulation. Code Status: DNR/DNI Disposition Plan: To home in 1-2 days  All the records are reviewed and case discussed with ED provider. Management plans discussed with the patient who expresses understanding and agree with plan of care.  Tor Tsuda D.O. on 11/18/2016 at 12:43 AM CC: Primary care physician; Dorothyann Peng, NP   11/18/2016, 12:43 AM

## 2016-11-18 NOTE — Progress Notes (Signed)
Pt continues with bleeding from rectum 10/10 pain. MD updated with VS, hemoglobin and pain. New orders placed

## 2016-11-19 LAB — URINALYSIS, ROUTINE W REFLEX MICROSCOPIC
Bilirubin Urine: NEGATIVE
GLUCOSE, UA: NEGATIVE mg/dL
KETONES UR: NEGATIVE mg/dL
NITRITE: NEGATIVE
PH: 6 (ref 5.0–8.0)
Protein, ur: 100 mg/dL — AB
SPECIFIC GRAVITY, URINE: 1.028 (ref 1.005–1.030)

## 2016-11-19 LAB — BASIC METABOLIC PANEL
Anion gap: 11 (ref 5–15)
BUN: 58 mg/dL — AB (ref 6–20)
CHLORIDE: 110 mmol/L (ref 101–111)
CO2: 21 mmol/L — AB (ref 22–32)
CREATININE: 1.65 mg/dL — AB (ref 0.61–1.24)
Calcium: 7.9 mg/dL — ABNORMAL LOW (ref 8.9–10.3)
GFR calc Af Amer: 38 mL/min — ABNORMAL LOW (ref >60)
GFR calc non Af Amer: 33 mL/min — ABNORMAL LOW (ref >60)
GLUCOSE: 121 mg/dL — AB (ref 65–99)
Potassium: 5 mmol/L (ref 3.5–5.1)
SODIUM: 142 mmol/L (ref 135–145)

## 2016-11-19 LAB — CBC
HCT: 28.6 % — ABNORMAL LOW (ref 39.0–52.0)
HEMOGLOBIN: 9.4 g/dL — AB (ref 13.0–17.0)
MCH: 29.1 pg (ref 26.0–34.0)
MCHC: 32.9 g/dL (ref 30.0–36.0)
MCV: 88.5 fL (ref 78.0–100.0)
Platelets: 191 10*3/uL (ref 150–400)
RBC: 3.23 MIL/uL — ABNORMAL LOW (ref 4.22–5.81)
RDW: 16.7 % — ABNORMAL HIGH (ref 11.5–15.5)
WBC: 14 10*3/uL — ABNORMAL HIGH (ref 4.0–10.5)

## 2016-11-19 LAB — PREPARE RBC (CROSSMATCH)

## 2016-11-19 MED ORDER — LORAZEPAM 2 MG/ML IJ SOLN
0.5000 mg | Freq: Once | INTRAMUSCULAR | Status: DC
  Filled 2016-11-19: qty 1

## 2016-11-19 MED ORDER — HYDROMORPHONE HCL 1 MG/ML IJ SOLN
1.0000 mg | INTRAMUSCULAR | Status: DC | PRN
  Administered 2016-11-19 – 2016-11-22 (×5): 1 mg via INTRAVENOUS
  Administered 2016-11-22: 2 mg via INTRAVENOUS
  Administered 2016-11-23: 1 mg via INTRAVENOUS
  Administered 2016-11-23: 2 mg via INTRAVENOUS
  Filled 2016-11-19 (×2): qty 1
  Filled 2016-11-19: qty 2
  Filled 2016-11-19 (×3): qty 1
  Filled 2016-11-19: qty 2
  Filled 2016-11-19 (×2): qty 1

## 2016-11-19 NOTE — Progress Notes (Signed)
PT Cancellation Note  Patient Details Name: Mark Velasquez MRN: [id number] DOB: 07-10-1916   Cancelled Treatment:    Reason Eval/Treat Not Completed: Medical issues which prohibited therapy; most recent Hgb 7.7 (down from 9.0), HR 113 at rest, pt with active bleeding last night and Palliative Consult pending;    [facility] 11/19/2016, 8:06 AM

## 2016-11-19 NOTE — Progress Notes (Signed)
Palliative Medicine RN Note: This is a GIP, covered admission with [facility]. I spoke with their liason, Harmon Pier. They will address GOC, as this is their admission. They will contact us if, after their discussions, unmet palliative needs remain.  Marjie Skiff Pruitt Taboada, RN, BSN, [facility] 11/19/2016 9:24 AM Cell [phone] 8:00-4:00 Monday-Friday Office [phone]

## 2016-11-19 NOTE — Progress Notes (Addendum)
[facility] - [facility] - GIP RN visit @ 09:30 AM  This is a related and covered GIP admission of 11/18/16 with [facility] diagnosis of Protein Calorie Malnutrition, per Dr. Tomasa Hosteller.  Patient has [facility] DNR.  EMS initiated by [facility] SN[facility], after it was noticed that patient was bleeding from rectum.  Patient had pulled out his catheter earlier, but it had been replaced.  [facility] RN was notified and went to facility.  Patient seen in room, resting comfortably.  No family/visitors present at time of visit.  Patient did not arouse to verbal or tactile stimuli.  Patient appears pale.  Patient is receiving Zosyn IV Q 8 hours via a PIV. Per chart review, patient received 1 dose of 4 mg Zofran IV for c/o nausea last night.  Patient is also receiving 40 mg Protonix IV Q 12 hours. Patient received 1 mg of Dilaudid for pain last night. Per discussion with staff RN that took care of patient yesterday, he was much more alert and attempting to get out bed yesterday.  Spoke to attending MD re: plan of care.  The plan at this time is for surgery to hold off further consultation.  If patient continues to decline over the next 24 hours, hospice may consider transfer to [facility] pending further discussion with family/patient, bed availability and medical eligibility.  [facility] will monitor everyday to assist in an appropriate disposition.  Addendum @ 10:45AM : Spoke to son Ryer and wife Olegario Shearer at bedside.  Wife tearful and offered emotional support.  Family verbalized that the patient would not want any heroic measures, therefore they decided that they did not want to pursue surgery.  Family stated they would like patient to transfer to [facility] for EOL if he continues to decline.  Family stated that the patient deserved to be comfortable, and they did not want him in any pain. As discussed with MD, hospice will evaluate patient's condition in the morning and follow-up on bed availability and  medical eligibility.  Phone call placed to hospice chaplain, per family request to offer spiritual support.  Please feel free to contact with any hospice-related questions or concerns.  Thank you,  Freddi Starr, RN, BSN [facility] Liaison [phone]  All hospital liaisons are now on [place].

## 2016-11-19 NOTE — Progress Notes (Signed)
Patient ID: Mark Velasquez, male   DOB: May 11, 1916, 83 y.o.   MRN: [id number]    PROGRESS NOTE   Mark Velasquez  IRJ:[id number] DOB: [birth year] DOA: 11/17/2016  PCP: Dorothyann Peng, NP   Brief Narrative:  Pt is 83 yo male who presented with sudden onset of rectal bleeding.   Assessment & Plan:   Active Problems: Rectal bleed - anterior rectal perforation suspected from known hx of brachytherapy  - Hg continues to drop, two U PRBC already requested for transfusion - discussed with family GOC, agreement made to observer pt on supportive care - per surgery team, will allow bowel rest, analgesia as needed, blood transfusion as needed, ABX - monitor clinical progress and ensuring comfort for pt, family in agreement  - hospice team following   Acute kidney injury - suspect pre renal etiology - allow comfort feeding as pt able to tolerate - BMP in AM  Leukocytosis - reactive and due to the above - continue Zosyn   DVT prophylaxis: SCD Code Status: DNR Family Communication: Family at bedside  Disposition Plan: to be determined   Consultants:   Hospice and palliative care team   Procedures:   None  Antimicrobials:   Zosyn 10/28 -->  Subjective: Still with rectal bleeding.   Objective: Vitals:   11/19/16 0557 11/19/16 0616 11/19/16 0644 11/19/16 0843  BP: 129/75 (!) 99/56 108/78 115/71  Pulse: (!) 128 96 (!) 113 (!) 118  Resp: 18 18 18 16   Temp: 98.6 F (37 C) 97.9 F (36.6 C) 98.2 F (36.8 C) 98.2 F (36.8 C)  TempSrc: Axillary Axillary Axillary Axillary  SpO2:    91%  Weight:      Height:        Intake/Output Summary (Last 24 hours) at 11/19/16 1155 Last data filed at 11/19/16 0844  Gross per 24 hour  Intake          1329.17 ml  Output              302 ml  Net          1027.17 ml   Filed Weights   11/18/16 0214  Weight: 66.5 kg (146 lb 9.7 oz)    Examination:  General exam: Appears calm and comfortable  Respiratory system: Clear to  auscultation. Respiratory effort normal. Cardiovascular system: S1 & S2 heard, RRR. No JVD, murmurs, rubs, gallops or clicks. No pedal edema. Gastrointestinal system: Abdomen is nondistended, soft and nontender. No organomegaly or masses felt. Normal bowel sounds heard. Central nervous system: Alert and oriented. No focal neurological deficits. Extremities: Symmetric 5 x 5 power. Skin: No rashes, lesions or ulcers Psychiatry: Judgement and insight appear normal. Mood & affect appropriate.    Data Reviewed: I have personally reviewed following labs and imaging studies  CBC:  Recent Labs Lab 11/17/16 2307 11/18/16 0312 11/18/16 0615 11/18/16 1007 11/18/16 1423 11/19/16 1122  WBC 10.8* 8.1 8.8 11.5* 12.0* 14.0*  NEUTROABS 8.2*  --   --   --   --   --   HGB 11.0* 9.5* 9.6* 9.0* 7.7* 9.4*  HCT 33.5* 29.4* 29.5* 27.5* 23.4* 28.6*  MCV 91.0 91.0 91.3 92.3 92.5 88.5  PLT 255 214 224 239 199 301   Basic Metabolic Panel:  Recent Labs Lab 11/17/16 2307 11/18/16 0312 11/19/16 1122  NA 142 143 142  K 4.7 4.3 5.0  CL 106 111 110  CO2 25 26 21*  GLUCOSE 118* 113* 121*  BUN 44* 46* 58*  CREATININE 1.39* 1.30* 1.65*  CALCIUM 8.9 8.4* 7.9*   Liver Function Tests:  Recent Labs Lab 11/17/16 2307  AST 23  ALT 9*  ALKPHOS 79  BILITOT 0.8  PROT 7.0  ALBUMIN 3.0*   Coagulation Profile:  Recent Labs Lab 11/17/16 2307  INR 1.24   Urine analysis:    Component Value Date/Time   COLORURINE RED (A) 11/19/2016 0700   APPEARANCEUR TURBID (A) 11/19/2016 0700   LABSPEC 1.028 11/19/2016 0700   PHURINE 6.0 11/19/2016 0700   GLUCOSEU NEGATIVE 11/19/2016 0700   HGBUR LARGE (A) 11/19/2016 0700   HGBUR negative 02/10/2010 0814   BILIRUBINUR NEGATIVE 11/19/2016 0700   BILIRUBINUR n 02/05/2013 1535   KETONESUR NEGATIVE 11/19/2016 0700   PROTEINUR 100 (A) 11/19/2016 0700   UROBILINOGEN 0.2 02/05/2013 1535   UROBILINOGEN 0.2 02/10/2010 0814   NITRITE NEGATIVE 11/19/2016 0700    LEUKOCYTESUR SMALL (A) 11/19/2016 0700   Recent Results (from the past 240 hour(s))  MRSA PCR Screening     Status: None   Collection Time: 11/18/16  2:40 AM  Result Value Ref Range Status   MRSA by PCR NEGATIVE NEGATIVE Final    Comment:        The GeneXpert MRSA Assay (FDA approved for NASAL specimens only), is one component of a comprehensive MRSA colonization surveillance program. It is not intended to diagnose MRSA infection nor to guide or monitor treatment for MRSA infections.     Radiology Studies: Ct Abdomen Pelvis Wo Contrast  Result Date: 11/18/2016 CLINICAL DATA:  Rectal bleeding. Recent accidental removal of suprapubic catheter with subsequent replacement. EXAM: CT ABDOMEN AND PELVIS WITHOUT CONTRAST TECHNIQUE: Multidetector CT imaging of the abdomen and pelvis was performed following the standard protocol without IV contrast. COMPARISON:  CT abdomen and pelvis 04/11/2009. Lumbar spine radiographs 06/06/2016. FINDINGS: Lower chest: Partially visualized moderate bilateral pleural effusions with compressive lower lobe atelectasis. Decreased attenuation of the blood pool relative to myocardium consistent with anemia. Hepatobiliary: No focal liver abnormality is seen. Unremarkable gallbladder. No biliary dilatation. Pancreas: Unremarkable. Spleen: Unremarkable. Adrenals/Urinary Tract: Unremarkable adrenal glands. Enlargement of an interpolar right renal cyst, now 2.5 cm. 1.3 cm lower pole left renal cyst, not evident on the prior study. No evidence of renal calculi or hydronephrosis. Suprapubic catheter present in the bladder. Gas and small volume fluid in the bladder. 2.5 cm focus of slightly higher density debris containing internal locules of gas posteriorly in the bladder on the right. Stomach/Bowel: The stomach is within normal limits. No evidence of bowel obstruction. Moderate amount of stool in the colon. Sigmoid colon diverticulosis without evidence of diverticulitis. There is  a defect in the anterior wall of the rectum with gas and fecal material extending anteriorly into the region of the prostate and pelvic floor. A small focus of gas is noted at the base of the penis. This does not extend further into the perineum or to the skin surface. Vascular/Lymphatic: Abdominal aortic atherosclerosis without aneurysm. No enlarged lymph nodes. Reproductive: Brachytherapy seeds in the prostate. Other: No intraperitoneal free fluid. No abdominal wall mass or hernia. Musculoskeletal: Remote L1 compression fracture status post augmentation. T12 vertebral fracture is nonacute given presence on the prior lumbar spine radiographs, however there is an unhealed horizontal cleft extending completely through the vertebral body without definite posterior element involvement. There is 55% vertebral body height loss, and retropulsion of the posterosuperior vertebral body by 8 mm results in mild-to-moderate spinal stenosis. Intramedullary right and proximal screw are partially visualized in the  right femur. IMPRESSION: 1. Anterior rectal perforation with stool extending into the region of the prostate. Suspected communication with the bladder as well given stool-like debris in the bladder. This could potentially reflect perforation from radiation proctitis related to prostate brachytherapy. 2. Colonic diverticulosis without evidence of diverticulitis. 3. Moderate pleural effusions. 4. Nonacute but unhealed L1 vertebral fracture. These results were called by telephone at the time of interpretation on 11/18/2016 at 4:35 pm to Dr. Mart Piggs , who verbally acknowledged these results. Electronically Signed   By: Logan Bores M.D.   On: 11/18/2016 16:40   Scheduled Meds: . antiseptic oral rinse  15 mL Mouth Rinse TID PC  . HYDROcodone-acetaminophen  1 tablet Oral TID  . LORazepam  0.5 mg Intravenous Once  . menthol-cetylpyridinium  1 lozenge Oral QHS  . pantoprazole (PROTONIX) IV  40 mg Intravenous Q12H  .  traMADol  50 mg Oral QHS   Continuous Infusions: . sodium chloride 50 mL/hr at 11/18/16 0319  . sodium chloride    . piperacillin-tazobactam (ZOSYN)  IV Stopped (11/19/16 0521)     LOS: 1 day   Time spent: 25 minutes   Faye Ramsay, MD [facility] Pager [phone]  If 7PM-7AM, please contact night-coverage [email or web address] Password [email or web address] 11/19/2016, 11:55 AM

## 2016-11-20 LAB — BASIC METABOLIC PANEL
ANION GAP: 8 (ref 5–15)
BUN: 68 mg/dL — AB (ref 6–20)
CO2: 24 mmol/L (ref 22–32)
CREATININE: 1.76 mg/dL — AB (ref 0.61–1.24)
Calcium: 8.1 mg/dL — ABNORMAL LOW (ref 8.9–10.3)
Chloride: 113 mmol/L — ABNORMAL HIGH (ref 101–111)
GFR, EST AFRICAN AMERICAN: 35 mL/min — AB (ref >60)
GFR, EST NON AFRICAN AMERICAN: 30 mL/min — AB (ref >60)
Glucose, Bld: 90 mg/dL (ref 65–99)
POTASSIUM: 4.7 mmol/L (ref 3.5–5.1)
SODIUM: 145 mmol/L (ref 135–145)

## 2016-11-20 LAB — BPAM RBC
BLOOD PRODUCT EXPIRATION DATE: 201811072359
BLOOD PRODUCT EXPIRATION DATE: 201811282359
Blood Product Expiration Date: 201811282359
ISSUE DATE / TIME: 201810290618
ISSUE DATE / TIME: 201810290348
Unit Type and Rh: 600
Unit Type and Rh: 9500
Unit Type and Rh: 9500

## 2016-11-20 LAB — TYPE AND SCREEN
ABO/RH(D): AB NEG
Antibody Screen: NEGATIVE
UNIT DIVISION: 0
UNIT DIVISION: 0
Unit division: 0

## 2016-11-20 LAB — CBC
HCT: 27.3 % — ABNORMAL LOW (ref 39.0–52.0)
HEMOGLOBIN: 8.8 g/dL — AB (ref 13.0–17.0)
MCH: 29 pg (ref 26.0–34.0)
MCHC: 32.2 g/dL (ref 30.0–36.0)
MCV: 90.1 fL (ref 78.0–100.0)
PLATELETS: 189 10*3/uL (ref 150–400)
RBC: 3.03 MIL/uL — AB (ref 4.22–5.81)
RDW: 17.3 % — ABNORMAL HIGH (ref 11.5–15.5)
WBC: 10.6 10*3/uL — AB (ref 4.0–10.5)

## 2016-11-20 MED ORDER — DOCUSATE SODIUM 50 MG/5ML PO LIQD
100.0000 mg | Freq: Two times a day (BID) | ORAL | Status: DC
  Administered 2016-11-20: 100 mg via ORAL
  Filled 2016-11-20 (×7): qty 10

## 2016-11-20 NOTE — Progress Notes (Signed)
[facility] - [facility] [place] - GIP RN visit @ 1130am  This is a related and covered GIP admission of 11/18/16 with [facility] diagnosis of Protein Calorie Malnutrition, per Dr. Tomasa Hosteller. Patient has [name] DNR. EMS initiated by [facility] SNF, after it was noticed that patient was bleeding from rectum. Patient had pulled out his catheter earlier, but it had been replaced. [facility] RN was notified and went to facility.  Patient seen in room, resting comfortably.  No family/visitors present at time of visit. Patient was awake and responsive, confused and his speech was not clear. He did not answer when  I ask if he was in pain but appears to be in no distress. Spoke with RN and no further labs or procedures are ordered. Jasmine Awe, CSW states he may transfer to [facility].   Medications: Zosyn 3.375g  IVPB Q 8hrs, NS@50ml /hr.  Dilaudid 1mg  IV PRN given at 09:49.   If patient is transferred to [facility] and needs ambulance transport, please call [facility] for transportation as pt is an existing [facility] patient.  Please feel free to contact with any hospice-related questions or concerns.  Thank you,  Farrel Gordon, RN, [facility] Liaison [phone]  All hospital liaisons are now on [place].

## 2016-11-20 NOTE — Clinical Social Work Note (Signed)
Clinical Social Work Assessment  Patient Details  Name: Mark Velasquez MRN: [id number] Date of Birth: 07-19-1916  Date of referral:  11/20/16               Reason for consult:  End of Life/Hospice                Permission sought to share information with:    Permission granted to share information::     Name::        Agency::     Relationship::     Contact Information:     Housing/Transportation Living arrangements for the past 2 months:  [place] of Information:  Medical Team, Palliative Care Team, Partner (hospice) Patient Interpreter Needed:  None Criminal Activity/Legal Involvement Pertinent to Current Situation/Hospitalization:  No - Comment as needed Significant Relationships:  Adult Children, Spouse, Community Support Lives with:  Facility Resident Do you feel safe going back to the place where you live?  No (end of life care) Need for family participation in patient care:  Yes (Comment) (pt not interactive)  Care giving concerns:  Pt from ALF [facility]- has had hospice services there. No caregiving concerns reported.    Social Worker assessment / plan:  CSW consulted to assess potential DC plan- pt is from ALF with hospice and has been declining during hospitalization. Per team transition to full comfort care likely if no improvement.  GIP admission- hospice following as well.  Pt not interactive- pt's sons Arvid and Quita Skye are primary decision makers. Pt's spouse at bedside as well.  Will follow for disposition planning needs.   Employment status:  Retired Forensic scientist:  [employer] PT Recommendations:  [place], [place] / Referral to community resources:     Patient/Family's Response to care:  Appreciative of WL and Hospice care  Patient/Family's Understanding of and Emotional Response to Diagnosis, Current Treatment, and Prognosis:  Family's emotional response is appropriate to situation-  acknowledge sadness at pt's decline and remark their focus is on "keeping him out of pain"  Emotional Assessment Appearance:  Appears stated age Attitude/Demeanor/Rapport:  Unresponsive (not interactive) Affect (typically observed):  Flat, Quiet Orientation:   (UTA) Alcohol / Substance use:  Not Applicable Psych involvement (Current and /or in the community):  No (Comment)  Discharge Needs  Concerns to be addressed:  Discharge Planning Concerns Readmission within the last 30 days:  No Current discharge risk:  None Barriers to Discharge:  Continued Medical Work up   [employer], LCSW 11/20/2016, 3:38 PM  [phone]

## 2016-11-20 NOTE — Progress Notes (Signed)
PT Cancellation Note  Patient Details Name: Mark Velasquez MRN: [id number] DOB: 1916-10-31   Cancelled Treatment:    Reason Eval/Treat Not Completed: Patient not medically ready, MSW advises to wait for Ruth- patient has been on Hospice.will check back.   Claretha Cooper 11/20/2016, 11:34 AM Tresa Endo PT [phone]

## 2016-11-20 NOTE — Progress Notes (Signed)
Patient ID: Mark Velasquez, male   DOB: Feb 21, 1916, 83 y.o.   MRN: [id number]    PROGRESS NOTE   Mark Velasquez  LYY:[id number] DOB: Mar 03, 1916 DOA: 11/17/2016  PCP: Dorothyann Peng, NP   Brief Narrative:  Pt is 83 yo male who presented with sudden onset of rectal bleeding.   Assessment & Plan:   Active Problems: Rectal bleed - anterior rectal perforation suspected from known hx of brachytherapy  - Hg continues to drop, two U PRBC transfused 10/28, Hg was up appropriately but Hg down again  - discussed with family GOC, agreement made to observer pt on supportive care - per surgery team, will allow bowel rest, analgesia as needed, blood transfusion as needed, ABX Zosyn - monitor clinical progress and ensuring comfort for pt, family in agreement  - hospice team following  - if no improvement in next 24 - 48 hours, family will likely opt for full comfort care   Acute kidney injury - suspect pre renal etiology - allow comfort feeding as pt able to tolerate - BMP in AM  Leukocytosis - reactive and due to the above - trending down  - continue Zosyn   DVT prophylaxis: SCD Code Status: DNR Family Communication: family at bedside  Disposition Plan: to be determined   Consultants:   Hospice and palliative care team   Procedures:   None  Antimicrobials:   Zosyn 10/28 -->  Subjective: Pt still with bleeding and pain.   Objective: Vitals:   11/19/16 0843 11/19/16 1449 11/19/16 2259 11/20/16 0545  BP: 115/71 96/72 100/68 108/76  Pulse: (!) 118 (!) 139 (!) 135 (!) 115  Resp: 16 16 18 16   Temp: 98.2 F (36.8 C) 97.6 F (36.4 C) 98 F (36.7 C) 97.7 F (36.5 C)  TempSrc: Axillary Oral Oral Axillary  SpO2: 91% 98% 100% 97%  Weight:      Height:        Intake/Output Summary (Last 24 hours) at 11/20/16 1404 Last data filed at 11/20/16 0828  Gross per 24 hour  Intake             1750 ml  Output              426 ml  Net             1324 ml   Filed Weights   11/18/16 0214  Weight: 66.5 kg (146 lb 9.7 oz)    Physical Exam  Constitutional: Appears calm, NAD CVS: tachycardic, no gallops or rubs  Pulmonary: Effort and breath sounds normal, diminished breath sounds at bases .  Abdominal: Soft. BS +,  no distension, noted tenderness in lower abd quadrants  Musculoskeletal: Normal range of motion. No edema and no tenderness.    Data Reviewed: I have personally reviewed following labs and imaging studies  CBC:  Recent Labs Lab 11/17/16 2307  11/18/16 0615 11/18/16 1007 11/18/16 1423 11/19/16 1122 11/20/16 0442  WBC 10.8*  < > 8.8 11.5* 12.0* 14.0* 10.6*  NEUTROABS 8.2*  --   --   --   --   --   --   HGB 11.0*  < > 9.6* 9.0* 7.7* 9.4* 8.8*  HCT 33.5*  < > 29.5* 27.5* 23.4* 28.6* 27.3*  MCV 91.0  < > 91.3 92.3 92.5 88.5 90.1  PLT 255  < > 224 239 199 191 189  < > = values in this interval not displayed. Basic Metabolic Panel:  Recent Labs Lab 11/17/16 2307 11/18/16 1275  11/19/16 1122 11/20/16 0442  NA 142 143 142 145  K 4.7 4.3 5.0 4.7  CL 106 111 110 113*  CO2 25 26 21* 24  GLUCOSE 118* 113* 121* 90  BUN 44* 46* 58* 68*  CREATININE 1.39* 1.30* 1.65* 1.76*  CALCIUM 8.9 8.4* 7.9* 8.1*   Liver Function Tests:  Recent Labs Lab 11/17/16 2307  AST 23  ALT 9*  ALKPHOS 79  BILITOT 0.8  PROT 7.0  ALBUMIN 3.0*   Coagulation Profile:  Recent Labs Lab 11/17/16 2307  INR 1.24   Urine analysis:    Component Value Date/Time   COLORURINE RED (A) 11/19/2016 0700   APPEARANCEUR TURBID (A) 11/19/2016 0700   LABSPEC 1.028 11/19/2016 0700   PHURINE 6.0 11/19/2016 0700   GLUCOSEU NEGATIVE 11/19/2016 0700   HGBUR LARGE (A) 11/19/2016 0700   HGBUR negative 02/10/2010 0814   BILIRUBINUR NEGATIVE 11/19/2016 0700   BILIRUBINUR n 02/05/2013 1535   KETONESUR NEGATIVE 11/19/2016 0700   PROTEINUR 100 (A) 11/19/2016 0700   UROBILINOGEN 0.2 02/05/2013 1535   UROBILINOGEN 0.2 02/10/2010 0814   NITRITE NEGATIVE 11/19/2016 0700    LEUKOCYTESUR SMALL (A) 11/19/2016 0700   Recent Results (from the past 240 hour(s))  MRSA PCR Screening     Status: None   Collection Time: 11/18/16  2:40 AM  Result Value Ref Range Status   MRSA by PCR NEGATIVE NEGATIVE Final    Comment:        The GeneXpert MRSA Assay (FDA approved for NASAL specimens only), is one component of a comprehensive MRSA colonization surveillance program. It is not intended to diagnose MRSA infection nor to guide or monitor treatment for MRSA infections.   Urine Culture     Status: None (Preliminary result)   Collection Time: 11/19/16  7:00 AM  Result Value Ref Range Status   Specimen Description URINE, SUPRAPUBIC  Final   Special Requests NONE  Final   Culture   Final    CULTURE REINCUBATED FOR BETTER GROWTH Performed at [facility], [place] [address] [place], [place] [address]    Report Status PENDING  Incomplete    Radiology Studies: Ct Abdomen Pelvis Wo Contrast  Result Date: 11/18/2016 CLINICAL DATA:  Rectal bleeding. Recent accidental removal of suprapubic catheter with subsequent replacement. EXAM: CT ABDOMEN AND PELVIS WITHOUT CONTRAST TECHNIQUE: Multidetector CT imaging of the abdomen and pelvis was performed following the standard protocol without IV contrast. COMPARISON:  CT abdomen and pelvis 04/11/2009. Lumbar spine radiographs 06/06/2016. FINDINGS: Lower chest: Partially visualized moderate bilateral pleural effusions with compressive lower lobe atelectasis. Decreased attenuation of the blood pool relative to myocardium consistent with anemia. Hepatobiliary: No focal liver abnormality is seen. Unremarkable gallbladder. No biliary dilatation. Pancreas: Unremarkable. Spleen: Unremarkable. Adrenals/Urinary Tract: Unremarkable adrenal glands. Enlargement of an interpolar right renal cyst, now 2.5 cm. 1.3 cm lower pole left renal cyst, not evident on the prior study. No evidence of renal calculi or hydronephrosis. Suprapubic catheter  present in the bladder. Gas and small volume fluid in the bladder. 2.5 cm focus of slightly higher density debris containing internal locules of gas posteriorly in the bladder on the right. Stomach/Bowel: The stomach is within normal limits. No evidence of bowel obstruction. Moderate amount of stool in the colon. Sigmoid colon diverticulosis without evidence of diverticulitis. There is a defect in the anterior wall of the rectum with gas and fecal material extending anteriorly into the region of the prostate and pelvic floor. A small focus of gas is  noted at the base of the penis. This does not extend further into the perineum or to the skin surface. Vascular/Lymphatic: Abdominal aortic atherosclerosis without aneurysm. No enlarged lymph nodes. Reproductive: Brachytherapy seeds in the prostate. Other: No intraperitoneal free fluid. No abdominal wall mass or hernia. Musculoskeletal: Remote L1 compression fracture status post augmentation. T12 vertebral fracture is nonacute given presence on the prior lumbar spine radiographs, however there is an unhealed horizontal cleft extending completely through the vertebral body without definite posterior element involvement. There is 55% vertebral body height loss, and retropulsion of the posterosuperior vertebral body by 8 mm results in mild-to-moderate spinal stenosis. Intramedullary right and proximal screw are partially visualized in the right femur. IMPRESSION: 1. Anterior rectal perforation with stool extending into the region of the prostate. Suspected communication with the bladder as well given stool-like debris in the bladder. This could potentially reflect perforation from radiation proctitis related to prostate brachytherapy. 2. Colonic diverticulosis without evidence of diverticulitis. 3. Moderate pleural effusions. 4. Nonacute but unhealed L1 vertebral fracture. These results were called by telephone at the time of interpretation on 11/18/2016 at 4:35 pm to Dr.  Mart Piggs , who verbally acknowledged these results. Electronically Signed   By: Logan Bores M.D.   On: 11/18/2016 16:40   Scheduled Meds: . antiseptic oral rinse  15 mL Mouth Rinse TID PC  . docusate  100 mg Oral BID  . HYDROcodone-acetaminophen  1 tablet Oral TID  . menthol-cetylpyridinium  1 lozenge Oral QHS  . pantoprazole (PROTONIX) IV  40 mg Intravenous Q12H  . traMADol  50 mg Oral QHS   Continuous Infusions: . sodium chloride 50 mL/hr at 11/18/16 0319  . sodium chloride    . piperacillin-tazobactam (ZOSYN)  IV 3.375 g (11/20/16 0949)     LOS: 2 days   Time spent: 25 minutes   Faye Ramsay, MD [facility] Pager [phone]  If 7PM-7AM, please contact night-coverage [email or web address] Password [email or web address] 11/20/2016, 2:04 PM

## 2016-11-20 NOTE — Progress Notes (Signed)
Physical Therapy Discharge Patient Details Name: Mark Velasquez MRN: [id number] DOB: April 12, 1916 Today's Date: 11/20/2016 Time:  -     Patient discharged from PT services secondary to medical decline -possible transg=fer to [facility], has been home with Hospice.    GP     Marcelino Freestone PT [phone]  11/20/2016, 1:54 PM

## 2016-11-21 DIAGNOSIS — Z515 Encounter for palliative care: Secondary | ICD-10-CM

## 2016-11-21 LAB — BASIC METABOLIC PANEL WITH GFR
Anion gap: 13 (ref 5–15)
BUN: 61 mg/dL — ABNORMAL HIGH (ref 6–20)
CO2: 20 mmol/L — ABNORMAL LOW (ref 22–32)
Calcium: 8.1 mg/dL — ABNORMAL LOW (ref 8.9–10.3)
Chloride: 114 mmol/L — ABNORMAL HIGH (ref 101–111)
Creatinine, Ser: 1.73 mg/dL — ABNORMAL HIGH (ref 0.61–1.24)
GFR calc Af Amer: 36 mL/min — ABNORMAL LOW
GFR calc non Af Amer: 31 mL/min — ABNORMAL LOW
Glucose, Bld: 78 mg/dL (ref 65–99)
Potassium: 4.4 mmol/L (ref 3.5–5.1)
Sodium: 147 mmol/L — ABNORMAL HIGH (ref 135–145)

## 2016-11-21 LAB — CBC
HCT: 25.9 % — ABNORMAL LOW (ref 39.0–52.0)
Hemoglobin: 8.4 g/dL — ABNORMAL LOW (ref 13.0–17.0)
MCH: 29.7 pg (ref 26.0–34.0)
MCHC: 32.4 g/dL (ref 30.0–36.0)
MCV: 91.5 fL (ref 78.0–100.0)
Platelets: 210 10*3/uL (ref 150–400)
RBC: 2.83 MIL/uL — ABNORMAL LOW (ref 4.22–5.81)
RDW: 17.4 % — ABNORMAL HIGH (ref 11.5–15.5)
WBC: 7.9 10*3/uL (ref 4.0–10.5)

## 2016-11-21 MED ORDER — PANTOPRAZOLE SODIUM 40 MG IV SOLR
40.0000 mg | INTRAVENOUS | Status: DC
  Administered 2016-11-22 – 2016-11-23 (×2): 40 mg via INTRAVENOUS
  Filled 2016-11-21: qty 40

## 2016-11-21 NOTE — Progress Notes (Signed)
Patient ID: Mark Velasquez, male   DOB: [birth year], 83 y.o.   MRN: [id number]    PROGRESS NOTE   Mark Velasquez  WUJ:[id number] DOB: [birth year] DOA: 11/17/2016  PCP: Dorothyann Peng, NP   Brief Narrative:  Pt is 83 yo male who presented with sudden onset of rectal bleeding.   Assessment & Plan:  Active Problems: Rectal bleed - anterior rectal perforation suspected from known hx of brachytherapy  - Hg continues to drop, two U PRBC transfused 10/28, Hg was up appropriately but Hg down again and continue to have intermittent bloody stools. - discussed with family GOC, agreement made to pursuit comfort care and symptoms management. - per surgery team not a candidate for surgical intervention - hospice team following   Acute kidney injury - suspect pre renal etiology - no further blood drawn to be done - after discussion with family plan is for comfort care and symptoms management only  Leukocytosis - reactive and due to acute process -given lack of overall improvement and the fact that patient will not be a candidate for curative intervention, will transition to full comfort and stop blood works and IV abx's. -focus on comfort and symptoms management   DVT prophylaxis: SCD's Code Status: DNR Family Communication: wife bedside  Disposition Plan: residential hospice at discharge; will start transitioning to full comfort.  Consultants:   Hospice and palliative care team   Procedures:   None  Antimicrobials:   Zosyn 10/28 -->10/31  Subjective: No feevr, no nausea or vomiting reported. Calmer today. Still with intermittent episodes of abd pain and having bloody stools.  Objective: Vitals:   11/20/16 2100 11/21/16 0547 11/21/16 1500 11/21/16 2043  BP: 116/81 118/73 135/77 (!) 140/108  Pulse: (!) 124 (!) 143 95 (!) 117  Resp: 16  16 18   Temp: 98.1 F (36.7 C) 98.2 F (36.8 C) 98 F (36.7 C) 98 F (36.7 C)  TempSrc: Axillary Axillary Axillary Axillary  SpO2: 92% 100%  100% 100%  Weight:      Height:        Intake/Output Summary (Last 24 hours) at 11/21/16 2320 Last data filed at 11/21/16 1700  Gross per 24 hour  Intake              450 ml  Output              300 ml  Net              150 ml   Filed Weights   11/18/16 0214  Weight: 66.5 kg (146 lb 9.7 oz)    Physical Exam  Constitutional: Afebrile, no CP, no acute distress. Non verbal and not eating. Patient with intermittent episodes of tachypnea and continue having bloody BM's. CVS: tachycardic, no rubs, no gallops, positive SEM Pulmonary: mild tachypnea, no wheezing, no crackles Abdominal: soft, mild grimaces appreciated when palpating LLQ,positive BS, no guarding. Musculoskeletal: no edema, no cyanosis, no clubbing.   Data Reviewed: I have personally reviewed following labs and imaging studies  CBC:  Recent Labs Lab 11/17/16 2307  11/18/16 1007 11/18/16 1423 11/19/16 1122 11/20/16 0442 11/21/16 0439  WBC 10.8*  < > 11.5* 12.0* 14.0* 10.6* 7.9  NEUTROABS 8.2*  --   --   --   --   --   --   HGB 11.0*  < > 9.0* 7.7* 9.4* 8.8* 8.4*  HCT 33.5*  < > 27.5* 23.4* 28.6* 27.3* 25.9*  MCV 91.0  < > 92.3 92.5 88.5 90.1  91.5  PLT 255  < > 239 199 191 189 210  < > = values in this interval not displayed. Basic Metabolic Panel:  Recent Labs Lab 11/17/16 2307 11/18/16 0312 11/19/16 1122 11/20/16 0442 11/21/16 0439  NA 142 143 142 145 147*  K 4.7 4.3 5.0 4.7 4.4  CL 106 111 110 113* 114*  CO2 25 26 21* 24 20*  GLUCOSE 118* 113* 121* 90 78  BUN 44* 46* 58* 68* 61*  CREATININE 1.39* 1.30* 1.65* 1.76* 1.73*  CALCIUM 8.9 8.4* 7.9* 8.1* 8.1*   Liver Function Tests:  Recent Labs Lab 11/17/16 2307  AST 23  ALT 9*  ALKPHOS 79  BILITOT 0.8  PROT 7.0  ALBUMIN 3.0*   Coagulation Profile:  Recent Labs Lab 11/17/16 2307  INR 1.24   Urine analysis:    Component Value Date/Time   COLORURINE RED (A) 11/19/2016 0700   APPEARANCEUR TURBID (A) 11/19/2016 0700   LABSPEC 1.028  11/19/2016 0700   PHURINE 6.0 11/19/2016 0700   GLUCOSEU NEGATIVE 11/19/2016 0700   HGBUR LARGE (A) 11/19/2016 0700   HGBUR negative 02/10/2010 0814   BILIRUBINUR NEGATIVE 11/19/2016 0700   BILIRUBINUR n 02/05/2013 1535   KETONESUR NEGATIVE 11/19/2016 0700   PROTEINUR 100 (A) 11/19/2016 0700   UROBILINOGEN 0.2 02/05/2013 1535   UROBILINOGEN 0.2 02/10/2010 0814   NITRITE NEGATIVE 11/19/2016 0700   LEUKOCYTESUR SMALL (A) 11/19/2016 0700   Recent Results (from the past 240 hour(s))  MRSA PCR Screening     Status: None   Collection Time: 11/18/16  2:40 AM  Result Value Ref Range Status   MRSA by PCR NEGATIVE NEGATIVE Final    Comment:        The GeneXpert MRSA Assay (FDA approved for NASAL specimens only), is one component of a comprehensive MRSA colonization surveillance program. It is not intended to diagnose MRSA infection nor to guide or monitor treatment for MRSA infections.   Urine Culture     Status: None (Preliminary result)   Collection Time: 11/19/16  7:00 AM  Result Value Ref Range Status   Specimen Description URINE, SUPRAPUBIC  Final   Special Requests NONE  Final   Culture   Final    CULTURE REINCUBATED FOR BETTER GROWTH Performed at [facility], [place] [address] [place], [place] [address]    Report Status PENDING  Incomplete    Radiology Studies: No results found. Scheduled Meds: . antiseptic oral rinse  15 mL Mouth Rinse TID PC  . docusate  100 mg Oral BID  . HYDROcodone-acetaminophen  1 tablet Oral TID  . menthol-cetylpyridinium  1 lozenge Oral QHS  . [START ON 11/22/2016] pantoprazole (PROTONIX) IV  40 mg Intravenous Q24H  . traMADol  50 mg Oral QHS   Continuous Infusions: . sodium chloride 50 mL/hr at 11/21/16 2039  . sodium chloride       LOS: 3 days   Time spent: 25 minutes   Barton Dubois, MD [facility] Pager [phone]  If 7PM-7AM, please contact night-coverage [email or web address] Password [email or web address] 11/21/2016, 11:20 PM

## 2016-11-21 NOTE — Progress Notes (Signed)
[facility] - [facility] [employer] - GIP RN visit @ 1015am  This is a related and covered GIP admission of 11/18/16 with [facility] diagnosis of Protein Calorie Malnutrition, per Dr. Tomasa Hosteller. Patient has [place] DNR. EMS initiated by [facility] SNF, after it was noticed that patient was bleeding from rectum. Patient had pulled out his catheter earlier, but it had been replaced. [facility] RN was notified and went to facility.  Visited with patient, wife at bedside. Patient not responsive to verbal or touch. Did not appear to be in distress or pain. Wife states she wants him to be comfortable and not be in pain.  Wife states she wants the patient to go [facility].  Sons are HCPOA, wife states she hopes sons will make a decision today.   Medications: Zosyn 3.375g  IVPB Q 8hrs, NS@50ml /hr. No PRNs given today.   If patient is transferred to [facility] and needs ambulance transport, please call [facility] for transportation as pt is an existing [facility] patient.  Please feel free to contact with any hospice-related questions or concerns.  Thank you,  Farrel Gordon, RN, [facility] Liaison [phone]  All hospital liaisons are now on [place].

## 2016-11-21 NOTE — Progress Notes (Signed)
   11/21/16 1604  Clinical Encounter Type  Visited With Patient  Visit Type Initial  Spiritual Encounters  Spiritual Needs Emotional;Prayer   Patient is on the Palliative list.  He was alone at this time.  A little hard to understand, but we chatted for a few minutes, he seemed a little distressed wondering what was happening.  As we talked some about God and prayed for peace at this time he was calmer and very appreciative.  Will follow as needed. Chaplain Katherene Ponto

## 2016-11-22 DIAGNOSIS — D62 Acute posthemorrhagic anemia: Secondary | ICD-10-CM

## 2016-11-22 DIAGNOSIS — K631 Perforation of intestine (nontraumatic): Secondary | ICD-10-CM

## 2016-11-22 DIAGNOSIS — N179 Acute kidney failure, unspecified: Secondary | ICD-10-CM

## 2016-11-22 LAB — URINE CULTURE

## 2016-11-22 MED ORDER — MORPHINE SULFATE (CONCENTRATE) 10 MG/0.5ML PO SOLN
5.0000 mg | ORAL | Status: DC | PRN
  Administered 2016-11-23 (×2): 5 mg via ORAL
  Filled 2016-11-22 (×2): qty 0.5

## 2016-11-22 NOTE — Progress Notes (Signed)
[facility] - [facility] [place] - GIP RN visit @ 08:15.  This is a related and covered GIP admission of 11/18/16 with [facility] diagnosis of Protein Calorie Malnutrition, per Dr. Tomasa Hosteller. Patient has [place] DNR. EMS initiated by [facility] SNF, after it was noticed that patient was bleeding from rectum. Patient had pulled out his catheter earlier, but it had been replaced. [facility] RN was notified and went to facility.  Visited patient at bedside. Patient did not respond to my voice, does not appear to be in distress. The plan is for the patient to go to [facility] when a bed becomes available according to the [place], and the family is in agreement with this.   Medications: NS@50ml /hr. No PRNs given today. Antibiotics have been discontinued.   If patient is transferred to [facility], please call [facility] for transportation as patient is an existing [facility] patient.  Please feel free to contact with any hospice-related questions or concerns.  Thank you,  Farrel Gordon, RN, [facility] Liaison [phone]  All hospital liaisons are now on [place].

## 2016-11-22 NOTE — Progress Notes (Signed)
Patient ID: Mark Velasquez, male   DOB: Jan 07, 1917, 83 y.o.   MRN: [id number]    PROGRESS NOTE   Mark Velasquez  WEX:[id number] DOB: [birth year] DOA: 11/17/2016  PCP: Dorothyann Peng, NP   Brief Narrative:  Pt is 83 yo male who presented with sudden onset of rectal bleeding.   Assessment & Plan:  Active Problems: Rectal bleed - anterior rectal perforation suspected from known hx of brachytherapy  - Hg continues to drop, two U PRBC transfused 10/28, Hg was up appropriately but Hg down again and continue to have intermittent bloody stools. - discussed with family GOC, agreement made to pursuit comfort care and symptoms management. - per surgery team not a candidate for surgical intervention - hospice team following  -Waiting for bed at [facility] place for discharge purposes -We will allow sips of clear liquids as part of her comfort feeding to alleviate mouth dryness sensation.  Continue also oral care.  Acute kidney injury - suspect pre renal etiology - no further blood drawn to be done - after discussion with family plan is for comfort care and symptoms management only  Leukocytosis - reactive and due to acute process -given lack of overall improvement and the fact that patient will not be a candidate for curative intervention, will transition to full comfort and stop blood works and IV abx's. -focus on comfort and symptoms management   DVT prophylaxis: SCD's Code Status: DNR Family Communication: No family at bedside Disposition Plan: residential hospice at discharge; continue full comfort care.   Consultants:   Hospice and palliative care team   Procedures:   None  Antimicrobials:   Zosyn 10/28 -->10/31  Subjective: Afebrile, no chest pain, no shortness of breath.  No orbital comfortable and in no acute distress.  Patient able to express by nodding and would like to have something to drink and is still expressing pain in his left lower quadrant.  Of note patient  overnight with multiple bloody stools appreciated.  Objective: Vitals:   11/21/16 1500 11/21/16 2043 11/22/16 0514 11/22/16 1555  BP: 135/77 (!) 140/108 (!) 106/55 (!) 120/102  Pulse: 95 (!) 117  80  Resp: 16 18 16 14   Temp: 98 F (36.7 C) 98 F (36.7 C) 97.9 F (36.6 C) 98.8 F (37.1 C)  TempSrc: Axillary Axillary Axillary Axillary  SpO2: 100% 100% 100%   Weight:      Height:        Intake/Output Summary (Last 24 hours) at 11/22/16 1642 Last data filed at 11/22/16 0600  Gross per 24 hour  Intake           1000.5 ml  Output              450 ml  Net            550.5 ml   Filed Weights   11/18/16 0214  Weight: 66.5 kg (146 lb 9.7 oz)    Physical Exam  Constitutional: Afebrile, in no acute distress.  Patient nonverbal no eating.  Vital signs stable.  Patient continue to have bloody bowel movements. CVS: Slightly tachycardic, no rubs, no gallops, positive systolic ejection murmur.  Pulmonary: No wheezing, no crackles, fair air movement.  Patient with normal respiratory effort.   Abdominal: Soft, reported some pain in his left lower quadrant, positive bowel sounds, no guarding.   Musculoskeletal: No edema, no cyanosis, no clubbing.     Data Reviewed: I have personally reviewed following labs and imaging studies  CBC:  Recent Labs Lab 11/17/16 2307  11/18/16 1007 11/18/16 1423 11/19/16 1122 11/20/16 0442 11/21/16 0439  WBC 10.8*  < > 11.5* 12.0* 14.0* 10.6* 7.9  NEUTROABS 8.2*  --   --   --   --   --   --   HGB 11.0*  < > 9.0* 7.7* 9.4* 8.8* 8.4*  HCT 33.5*  < > 27.5* 23.4* 28.6* 27.3* 25.9*  MCV 91.0  < > 92.3 92.5 88.5 90.1 91.5  PLT 255  < > 239 199 191 189 210  < > = values in this interval not displayed. Basic Metabolic Panel:  Recent Labs Lab 11/17/16 2307 11/18/16 0312 11/19/16 1122 11/20/16 0442 11/21/16 0439  NA 142 143 142 145 147*  K 4.7 4.3 5.0 4.7 4.4  CL 106 111 110 113* 114*  CO2 25 26 21* 24 20*  GLUCOSE 118* 113* 121* 90 78  BUN 44*  46* 58* 68* 61*  CREATININE 1.39* 1.30* 1.65* 1.76* 1.73*  CALCIUM 8.9 8.4* 7.9* 8.1* 8.1*   Liver Function Tests:  Recent Labs Lab 11/17/16 2307  AST 23  ALT 9*  ALKPHOS 79  BILITOT 0.8  PROT 7.0  ALBUMIN 3.0*   Coagulation Profile:  Recent Labs Lab 11/17/16 2307  INR 1.24   Urine analysis:    Component Value Date/Time   COLORURINE RED (A) 11/19/2016 0700   APPEARANCEUR TURBID (A) 11/19/2016 0700   LABSPEC 1.028 11/19/2016 0700   PHURINE 6.0 11/19/2016 0700   GLUCOSEU NEGATIVE 11/19/2016 0700   HGBUR LARGE (A) 11/19/2016 0700   HGBUR negative 02/10/2010 0814   BILIRUBINUR NEGATIVE 11/19/2016 0700   BILIRUBINUR n 02/05/2013 1535   KETONESUR NEGATIVE 11/19/2016 0700   PROTEINUR 100 (A) 11/19/2016 0700   UROBILINOGEN 0.2 02/05/2013 1535   UROBILINOGEN 0.2 02/10/2010 0814   NITRITE NEGATIVE 11/19/2016 0700   LEUKOCYTESUR SMALL (A) 11/19/2016 0700   Recent Results (from the past 240 hour(s))  MRSA PCR Screening     Status: None   Collection Time: 11/18/16  2:40 AM  Result Value Ref Range Status   MRSA by PCR NEGATIVE NEGATIVE Final    Comment:        The GeneXpert MRSA Assay (FDA approved for NASAL specimens only), is one component of a comprehensive MRSA colonization surveillance program. It is not intended to diagnose MRSA infection nor to guide or monitor treatment for MRSA infections.   Urine Culture     Status: Abnormal   Collection Time: 11/19/16  7:00 AM  Result Value Ref Range Status   Specimen Description URINE, SUPRAPUBIC  Final   Special Requests NONE  Final   Culture (A)  Final    >=100,000 COLONIES/mL PSEUDOMONAS AERUGINOSA 50,000 COLONIES/mL KLEBSIELLA PNEUMONIAE 50,000 COLONIES/mL CITROBACTER FREUNDII    Report Status 11/22/2016 FINAL  Final   Organism ID, Bacteria PSEUDOMONAS AERUGINOSA (A)  Final   Organism ID, Bacteria KLEBSIELLA PNEUMONIAE (A)  Final   Organism ID, Bacteria CITROBACTER FREUNDII (A)  Final      Susceptibility    Citrobacter freundii - MIC*    CEFAZOLIN >=64 RESISTANT Resistant     CEFTRIAXONE >=64 RESISTANT Resistant     CIPROFLOXACIN 1 SENSITIVE Sensitive     GENTAMICIN <=1 SENSITIVE Sensitive     IMIPENEM 1 SENSITIVE Sensitive     NITROFURANTOIN <=16 SENSITIVE Sensitive     TRIMETH/SULFA >=320 RESISTANT Resistant     PIP/TAZO >=128 RESISTANT Resistant     * 50,000 COLONIES/mL CITROBACTER FREUNDII   Klebsiella pneumoniae -  MIC*    AMPICILLIN >=32 RESISTANT Resistant     CEFAZOLIN <=4 SENSITIVE Sensitive     CEFTRIAXONE <=1 SENSITIVE Sensitive     CIPROFLOXACIN <=0.25 SENSITIVE Sensitive     GENTAMICIN <=1 SENSITIVE Sensitive     IMIPENEM <=0.25 SENSITIVE Sensitive     NITROFURANTOIN 64 INTERMEDIATE Intermediate     TRIMETH/SULFA <=20 SENSITIVE Sensitive     AMPICILLIN/SULBACTAM 8 SENSITIVE Sensitive     PIP/TAZO <=4 SENSITIVE Sensitive     Extended ESBL NEGATIVE Sensitive     * 50,000 COLONIES/mL KLEBSIELLA PNEUMONIAE   Pseudomonas aeruginosa - MIC*    CEFTAZIDIME 8 SENSITIVE Sensitive     CIPROFLOXACIN >=4 RESISTANT Resistant     GENTAMICIN 8 INTERMEDIATE Intermediate     IMIPENEM 1 SENSITIVE Sensitive     PIP/TAZO 32 SENSITIVE Sensitive     CEFEPIME 16 INTERMEDIATE Intermediate     CEFAZOLIN <=4      * >=100,000 COLONIES/mL PSEUDOMONAS AERUGINOSA    Radiology Studies: No results found. Scheduled Meds: . antiseptic oral rinse  15 mL Mouth Rinse TID PC  . docusate  100 mg Oral BID  . HYDROcodone-acetaminophen  1 tablet Oral TID  . menthol-cetylpyridinium  1 lozenge Oral QHS  . pantoprazole (PROTONIX) IV  40 mg Intravenous Q24H  . traMADol  50 mg Oral QHS   Continuous Infusions: . sodium chloride 20 mL/hr at 11/21/16 2321     LOS: 4 days   Time spent: 25 minutes   Barton Dubois, MD [facility] Pager [phone]  If 7PM-7AM, please contact night-coverage [email or web address] Password [facility] 11/22/2016, 4:42 PM

## 2016-11-22 NOTE — Progress Notes (Signed)
CSW following for disposition.  Left voicemail with pt's son. Pt currently referred to [facility] for residential hospice care. Pt is active with [facility] prior to and during this admission. Spoke with [facility] liaison this morning as well and CSW/pt will be notified once [employer] available.  Sharren Bridge, MSW, LCSW Clinical Social Work 11/22/2016 [phone]

## 2016-11-22 NOTE — Progress Notes (Signed)
RN paged earlier because pt was bleeding rectally and from SP catheter. Stat H/H ordered. However, later when NP was checking for results, the H/H had been d/c'd. NP called RN and she said that attending d/c'd the order from home and wrote no more "blood draws" for pt.  KJKG, NP [facility]

## 2016-11-23 MED ORDER — LORAZEPAM 2 MG/ML IJ SOLN: 1.0000 mg | INTRAMUSCULAR | Status: DC | PRN

## 2016-11-23 MED ORDER — LORAZEPAM 0.5 MG PO TABS: 0.5000 mg | ORAL_TABLET | Freq: Four times a day (QID) | ORAL | Status: DC | PRN

## 2016-11-23 MED ORDER — BISACODYL 10 MG RE SUPP: 10.0000 mg | Freq: Every day | RECTAL | Status: DC | PRN

## 2016-11-23 MED ORDER — HYDROMORPHONE HCL 1 MG/ML IJ SOLN: 1.0000 mg | INTRAMUSCULAR | Status: DC | PRN

## 2016-11-23 NOTE — Progress Notes (Signed)
Patient ID: Mark Velasquez, male   DOB: 10/12/1916, 83 y.o.   MRN: [id number]    PROGRESS NOTE   Mark Velasquez  WUJ:[id number] DOB: Jun 04, 1916 DOA: 11/17/2016  PCP: Dorothyann Peng, NP   Brief Narrative:  Pt is 83 yo male who presented with sudden onset of rectal bleeding.   Assessment & Plan:  Active Problems: Rectal bleed - anterior rectal perforation suspected from known hx of brachytherapy  - Hg continues to drop, two U PRBC transfused 10/28, Hg was up appropriately but Hg down again and continue to have intermittent bloody stools. - discussed with family GOC, agreement made to pursuit comfort care and symptoms management. - per surgery team not a candidate for surgical intervention - hospice team following  -patient with acute change in status; now nonverbal, with agonal breathing and tachypnea. -anticipate hospital death -continue analgesics and anxiolytics as needed. -We will allow sips of clear liquids as part of her comfort feeding to alleviate mouth dryness sensation.  Continue also oral care.  Acute kidney injury -suspect pre renal etiology -no further blood drawn to be done -after discussion with family plan is for comfort care and symptoms management only  Leukocytosis -reactive and due to acute process -given lack of overall improvement and the fact that patient will not be a candidate for curative intervention, will transition to full comfort and stop blood works and IV abx's. -focus on comfort and symptoms management   DVT prophylaxis: SCD's Code Status: DNR Family Communication: No family at bedside Disposition Plan: with acute changes, anticipate patient will die inpatient.   Consultants:   Hospice and palliative care team   Procedures:   None  Antimicrobials:   Zosyn 10/28 -->10/31  Subjective: No fever, non verbal and with presence of agonal breathing. Patient appears to be actively dying.   Objective: Vitals:   11/22/16 2021 11/22/16 2235  11/23/16 0550 11/23/16 1306  BP:  101/66 (!) 75/50 (!) 78/53  Pulse: 78 84 96 84  Resp: 16 15 13 11   Temp:  97.6 F (36.4 C) 97.8 F (36.6 C) 97.8 F (36.6 C)  TempSrc:  Axillary Axillary Oral  SpO2: 98% 100% 100%   Weight:      Height:        Intake/Output Summary (Last 24 hours) at 11/23/16 2210 Last data filed at 11/23/16 1500  Gross per 24 hour  Intake              240 ml  Output               50 ml  Net              190 ml   Filed Weights   11/18/16 0214  Weight: 66.5 kg (146 lb 9.7 oz)    Physical Exam  Constitutional: no fever. With agonal breathing and non verbal. Patient appears to be actively dying.  CVS: no rubs, no gallops, positive SEM, mild tachycardia appreciated.  Pulmonary: no wheezing, no crackles, fair air movement, agonal breathing and tachypnea present.   Abdominal: soft, NT, ND, positive BS.  Musculoskeletal: no edema, no cyanosis.    Data Reviewed: I have personally reviewed following labs and imaging studies  CBC:  Recent Labs Lab 11/17/16 2307  11/18/16 1007 11/18/16 1423 11/19/16 1122 11/20/16 0442 11/21/16 0439  WBC 10.8*  < > 11.5* 12.0* 14.0* 10.6* 7.9  NEUTROABS 8.2*  --   --   --   --   --   --  HGB 11.0*  < > 9.0* 7.7* 9.4* 8.8* 8.4*  HCT 33.5*  < > 27.5* 23.4* 28.6* 27.3* 25.9*  MCV 91.0  < > 92.3 92.5 88.5 90.1 91.5  PLT 255  < > 239 199 191 189 210  < > = values in this interval not displayed. Basic Metabolic Panel:  Recent Labs Lab 11/17/16 2307 11/18/16 0312 11/19/16 1122 11/20/16 0442 11/21/16 0439  NA 142 143 142 145 147*  K 4.7 4.3 5.0 4.7 4.4  CL 106 111 110 113* 114*  CO2 25 26 21* 24 20*  GLUCOSE 118* 113* 121* 90 78  BUN 44* 46* 58* 68* 61*  CREATININE 1.39* 1.30* 1.65* 1.76* 1.73*  CALCIUM 8.9 8.4* 7.9* 8.1* 8.1*   Liver Function Tests:  Recent Labs Lab 11/17/16 2307  AST 23  ALT 9*  ALKPHOS 79  BILITOT 0.8  PROT 7.0  ALBUMIN 3.0*   Coagulation Profile:  Recent Labs Lab 11/17/16 2307    INR 1.24   Urine analysis:    Component Value Date/Time   COLORURINE RED (A) 11/19/2016 0700   APPEARANCEUR TURBID (A) 11/19/2016 0700   LABSPEC 1.028 11/19/2016 0700   PHURINE 6.0 11/19/2016 0700   GLUCOSEU NEGATIVE 11/19/2016 0700   HGBUR LARGE (A) 11/19/2016 0700   HGBUR negative 02/10/2010 0814   BILIRUBINUR NEGATIVE 11/19/2016 0700   BILIRUBINUR n 02/05/2013 1535   KETONESUR NEGATIVE 11/19/2016 0700   PROTEINUR 100 (A) 11/19/2016 0700   UROBILINOGEN 0.2 02/05/2013 1535   UROBILINOGEN 0.2 02/10/2010 0814   NITRITE NEGATIVE 11/19/2016 0700   LEUKOCYTESUR SMALL (A) 11/19/2016 0700   Recent Results (from the past 240 hour(s))  MRSA PCR Screening     Status: None   Collection Time: 11/18/16  2:40 AM  Result Value Ref Range Status   MRSA by PCR NEGATIVE NEGATIVE Final    Comment:        The GeneXpert MRSA Assay (FDA approved for NASAL specimens only), is one component of a comprehensive MRSA colonization surveillance program. It is not intended to diagnose MRSA infection nor to guide or monitor treatment for MRSA infections.   Urine Culture     Status: Abnormal   Collection Time: 11/19/16  7:00 AM  Result Value Ref Range Status   Specimen Description URINE, SUPRAPUBIC  Final   Special Requests NONE  Final   Culture (A)  Final    >=100,000 COLONIES/mL PSEUDOMONAS AERUGINOSA 50,000 COLONIES/mL KLEBSIELLA PNEUMONIAE 50,000 COLONIES/mL CITROBACTER FREUNDII    Report Status 11/22/2016 FINAL  Final   Organism ID, Bacteria PSEUDOMONAS AERUGINOSA (A)  Final   Organism ID, Bacteria KLEBSIELLA PNEUMONIAE (A)  Final   Organism ID, Bacteria CITROBACTER FREUNDII (A)  Final      Susceptibility   Citrobacter freundii - MIC*    CEFAZOLIN >=64 RESISTANT Resistant     CEFTRIAXONE >=64 RESISTANT Resistant     CIPROFLOXACIN 1 SENSITIVE Sensitive     GENTAMICIN <=1 SENSITIVE Sensitive     IMIPENEM 1 SENSITIVE Sensitive     NITROFURANTOIN <=16 SENSITIVE Sensitive      TRIMETH/SULFA >=320 RESISTANT Resistant     PIP/TAZO >=128 RESISTANT Resistant     * 50,000 COLONIES/mL CITROBACTER FREUNDII   Klebsiella pneumoniae - MIC*    AMPICILLIN >=32 RESISTANT Resistant     CEFAZOLIN <=4 SENSITIVE Sensitive     CEFTRIAXONE <=1 SENSITIVE Sensitive     CIPROFLOXACIN <=0.25 SENSITIVE Sensitive     GENTAMICIN <=1 SENSITIVE Sensitive     IMIPENEM <=0.25 SENSITIVE  Sensitive     NITROFURANTOIN 64 INTERMEDIATE Intermediate     TRIMETH/SULFA <=20 SENSITIVE Sensitive     AMPICILLIN/SULBACTAM 8 SENSITIVE Sensitive     PIP/TAZO <=4 SENSITIVE Sensitive     Extended ESBL NEGATIVE Sensitive     * 50,000 COLONIES/mL KLEBSIELLA PNEUMONIAE   Pseudomonas aeruginosa - MIC*    CEFTAZIDIME 8 SENSITIVE Sensitive     CIPROFLOXACIN >=4 RESISTANT Resistant     GENTAMICIN 8 INTERMEDIATE Intermediate     IMIPENEM 1 SENSITIVE Sensitive     PIP/TAZO 32 SENSITIVE Sensitive     CEFEPIME 16 INTERMEDIATE Intermediate     CEFAZOLIN <=4      * >=100,000 COLONIES/mL PSEUDOMONAS AERUGINOSA    Radiology Studies: No results found. Scheduled Meds: . antiseptic oral rinse  15 mL Mouth Rinse TID PC  . HYDROcodone-acetaminophen  1 tablet Oral TID  . menthol-cetylpyridinium  1 lozenge Oral QHS  . pantoprazole (PROTONIX) IV  40 mg Intravenous Q24H  . traMADol  50 mg Oral QHS   Continuous Infusions: . sodium chloride 20 mL/hr at 11/21/16 2321     LOS: 5 days   Time spent: 20 minutes   Barton Dubois, MD [facility] Pager [phone]  If 7PM-7AM, please contact night-coverage [email or web address] Password [email or web address] 11/23/2016, 10:10 PM

## 2016-11-23 NOTE — Progress Notes (Signed)
   11/23/16 1600  Clinical Encounter Type  Visited With Patient;Health care provider  Visit Type Follow-up  Spiritual Encounters  Spiritual Needs Prayer   [phone] with patient for a while and nurse has been in and out sitting with him.  Son was present for most of the morning.  Continue to just provide a presence at this stage for the patient. Chaplain Katherene Ponto

## 2016-11-23 NOTE — Progress Notes (Signed)
[facility] 1436 - [facility] - GIP RN visit @ 10:30am.  This is a related and covered GIP admission of 11/18/16 with [facility] diagnosis of Protein Calorie Malnutrition, per Dr. Tomasa Hosteller. Patient has [place] DNR. EMS initiated by [facility], after it was noticed that patient was bleeding from rectum. Patient had pulled out his catheter earlier, but it had been replaced. [facility] RN was notified and went to facility.  Visited patient at bedside. Patient does not respond to voice or touch. Physician has determined he is no longer stable for transport to [facility]. If he stabilizes he maybe able to go tomorrow.    Medications: NS@20ml /hr. Dilaudid 1mg  IV PRN given at 01:05am.  Antibiotics have been discontinued.   If patient is transferred to [facility], please call [facility] for transportation as patient is an existing [facility] patient.  Please feel free to contact with any hospice-related questions or concerns.  Thank you,  Farrel Gordon, RN, [facility] Liaison [phone]  All hospital liaisons are now on [place].

## 2016-11-23 NOTE — Progress Notes (Signed)
Nutrition Brief Note  Chart reviewed. Pt now transitioning to comfort care.  No further nutrition interventions warranted at this time.  Please re-consult as needed.   Aylssa Herrig RD, LDN Clinical Nutrition Pager # - [phone]    

## 2016-11-24 DIAGNOSIS — D62 Acute posthemorrhagic anemia: Secondary | ICD-10-CM

## 2016-11-24 DIAGNOSIS — N390 Urinary tract infection, site not specified: Secondary | ICD-10-CM

## 2016-11-24 DIAGNOSIS — E43 Unspecified severe protein-calorie malnutrition: Secondary | ICD-10-CM

## 2016-11-24 MED ORDER — ACETAMINOPHEN 650 MG RE SUPP: 650.0000 mg | Freq: Four times a day (QID) | RECTAL | Status: AC | PRN

## 2016-11-24 MED ORDER — LORAZEPAM 2 MG/ML IJ SOLN: 1.0000 mg | INTRAMUSCULAR | Status: AC | PRN

## 2016-11-24 MED ORDER — MORPHINE SULFATE (CONCENTRATE) 10 MG/0.5ML PO SOLN: 5.0000 mg | ORAL | Status: AC | PRN

## 2016-11-24 MED ORDER — ACETAMINOPHEN 325 MG PO TABS: 650.0000 mg | ORAL_TABLET | Freq: Four times a day (QID) | ORAL | Status: AC | PRN

## 2016-11-24 MED ORDER — HYDROMORPHONE HCL 1 MG/ML IJ SOLN: 1.0000 mg | INTRAMUSCULAR | 0 refills | Status: AC | PRN

## 2016-11-24 MED ORDER — GLYCOPYRROLATE 0.2 MG/ML IJ SOLN: 0.2000 mg | INTRAMUSCULAR | Status: AC | PRN

## 2016-11-24 MED ORDER — MENTHOL 3 MG MT LOZG: 1.0000 | LOZENGE | Freq: Every day | OROMUCOSAL | Status: AC

## 2016-11-24 MED ORDER — ALBUTEROL SULFATE (2.5 MG/3ML) 0.083% IN NEBU: 2.5000 mg | INHALATION_SOLUTION | Freq: Four times a day (QID) | RESPIRATORY_TRACT | Status: AC | PRN

## 2016-11-24 NOTE — Discharge Summary (Signed)
Physician Discharge Summary  Mark Velasquez FWY:[id number] DOB: 1916/01/30 DOA: 11/17/2016  PCP: Dorothyann Peng, NP  Admit date: 11/17/2016 Discharge date: 11/24/2016  Time spent: 35 minutes  Recommendations for Outpatient Follow-up:  Comfort care and symptomatic management   Discharge Diagnoses:  Active Problems:   Lower GI bleeding   Severe protein-calorie malnutrition (HCC)   Acute blood loss anemia   Acute lower UTI   Discharge Condition: will transfer to [facility] for symptoms management and end of life care.  Diet recommendation: comfort feeding   Filed Weights   11/18/16 0214  Weight: 66.5 kg (146 lb 9.7 oz)    History of present illness:  83 y.o. male with a known history of BPH, CKD4, afib, HLD, HTN presents to the emergency department for evaluation of rectal bleeding.  Patient is currently being cared for by [facility] but was sent into the emergency department by his son for evaluation of bright red blood per rectum.Marland Kitchen  Hospital Course:  Rectal bleed - anterior rectal perforation suspected from known hx of brachytherapy  - Hg continues to drop, two U PRBC transfused 10/28, Hg was up appropriately but Hg down again and continue to have intermittent bloody stools. - discussed with family GOC, agreement made to pursuit comfort care and symptoms management. - per surgery team not a candidate for surgical intervention - hospice team following  -patient condition has stabilize and he is comfortable at this time. Family will like to transfer him to [facility]. -continue analgesics and anxiolytics as needed. -Robinul for increase oral secretions  -We will allow sips of clear liquids as part of her comfort feeding to alleviate mouth dryness sensation.  Continue also oral care.  Acute kidney injury -suspect pre renal etiology -no further blood drawn to be done -after discussion with family plan is for comfort care and symptoms management only -no  further work up or blood work anticipated  Leukocytosis -reactive and due to acute process -given lack of overall improvement and the fact that patient will not be a candidate for curative intervention, will transition to full comfort and stop blood works and IV abx's. -focus on comfort and symptoms management   Severe protein calorie malnutrition  -comfort feeding now  Pseudomonas, citrobacter and klebsiella UTI -plan is for comfort care only -afebrile and no having dysuria -keep foley in place for comfort care.  Procedures:  See below for x-ray reports   Consultations:  Hospice   CCS  Discharge Exam: Vitals:   11/23/16 2250 11/24/16 0558  BP: 115/81 (!) 99/59  Pulse: 91 (!) 112  Resp: 16 (!) 24  Temp: 97.7 F (36.5 C) 97.9 F (36.6 C)  SpO2: 100% 100%   Constitutional: no fever. In no acute distress and with stabilization of VS. Patient appears comfortable. Family will like him discharge to [facility]. CVS: no rubs, no gallops, positive SEM, mild tachycardia appreciated.  Pulmonary: no wheezing, no crackles, fair air movement, agonal breathing and tachypnea present.   Abdominal: soft, NT, ND, positive BS.  Musculoskeletal: no edema, no cyanosis.    Discharge Instructions   Discharge Instructions    Discharge instructions    Complete by:  As directed    Comfort care and symptom management Comfort feeding     Current Discharge Medication List    START taking these medications   Details  acetaminophen (TYLENOL) 325 MG tablet Take 2 tablets (650 mg total) by mouth every 6 (six) hours as needed for mild pain (  or Fever >/= 101).    acetaminophen (TYLENOL) 650 MG suppository Place 1 suppository (650 mg total) rectally every 6 (six) hours as needed for mild pain (or Fever >/= 101).    albuterol (PROVENTIL) (2.5 MG/3ML) 0.083% nebulizer solution Take 3 mLs (2.5 mg total) by nebulization every 6 (six) hours as needed for wheezing or shortness of breath.     glycopyrrolate (ROBINUL) 0.2 MG/ML injection Inject 1 mL (0.2 mg total) into the vein every 4 (four) hours as needed (increase secretions.).    HYDROmorphone (DILAUDID) 1 MG/ML injection Inject 1-2 mLs (1-2 mg total) into the vein every 2 (two) hours as needed for moderate pain or severe pain (SOB and agitation). Qty: 1 mL, Refills: 0    LORazepam (ATIVAN) 2 MG/ML injection Inject 0.5 mLs (1 mg total) into the vein every 4 (four) hours as needed for anxiety (agitation).    menthol-cetylpyridinium (CEPACOL) 3 MG lozenge Take 1 lozenge (3 mg total) by mouth at bedtime.    Morphine Sulfate (MORPHINE CONCENTRATE) 10 MG/0.5ML SOLN concentrated solution Take 0.25 mLs (5 mg total) by mouth every 3 (three) hours as needed for severe pain, anxiety or shortness of breath.      CONTINUE these medications which have NOT CHANGED   Details  antiseptic oral rinse (BIOTENE) LIQD 15 mLs by Mouth Rinse route 3 (three) times daily after meals.    OXYGEN Inhale 2 L into the lungs daily as needed (SOB).    Propylene Glycol 0.95 % SOLN Place 1 drop into both eyes 2 (two) times daily as needed. Qty: 90 each, Refills: 1      STOP taking these medications     Artificial Saliva (ACT DRY MOUTH) LOZG      aspirin EC 81 MG tablet      feeding supplement (BOOST HIGH PROTEIN) LIQD      HYDROcodone-acetaminophen (NORCO/VICODIN) 5-325 MG tablet      loperamide (IMODIUM A-D) 2 MG tablet      LORazepam (ATIVAN) 0.5 MG tablet      meloxicam (MOBIC) 7.5 MG tablet      senna (SENOKOT) 8.6 MG TABS tablet      traMADol (ULTRAM) 50 MG tablet      mirtazapine (REMERON) 15 MG tablet        Allergies  Allergen Reactions  . Iodinated Diagnostic Agents Nausea And Vomiting      The results of significant diagnostics from this hospitalization (including imaging, microbiology, ancillary and laboratory) are listed below for reference.    Significant Diagnostic Studies: Ct Abdomen Pelvis Wo Contrast  Result  Date: 11/18/2016 CLINICAL DATA:  Rectal bleeding. Recent accidental removal of suprapubic catheter with subsequent replacement. EXAM: CT ABDOMEN AND PELVIS WITHOUT CONTRAST TECHNIQUE: Multidetector CT imaging of the abdomen and pelvis was performed following the standard protocol without IV contrast. COMPARISON:  CT abdomen and pelvis 04/11/2009. Lumbar spine radiographs 06/06/2016. FINDINGS: Lower chest: Partially visualized moderate bilateral pleural effusions with compressive lower lobe atelectasis. Decreased attenuation of the blood pool relative to myocardium consistent with anemia. Hepatobiliary: No focal liver abnormality is seen. Unremarkable gallbladder. No biliary dilatation. Pancreas: Unremarkable. Spleen: Unremarkable. Adrenals/Urinary Tract: Unremarkable adrenal glands. Enlargement of an interpolar right renal cyst, now 2.5 cm. 1.3 cm lower pole left renal cyst, not evident on the prior study. No evidence of renal calculi or hydronephrosis. Suprapubic catheter present in the bladder. Gas and small volume fluid in the bladder. 2.5 cm focus of slightly higher density debris containing internal locules of gas  posteriorly in the bladder on the right. Stomach/Bowel: The stomach is within normal limits. No evidence of bowel obstruction. Moderate amount of stool in the colon. Sigmoid colon diverticulosis without evidence of diverticulitis. There is a defect in the anterior wall of the rectum with gas and fecal material extending anteriorly into the region of the prostate and pelvic floor. A small focus of gas is noted at the base of the penis. This does not extend further into the perineum or to the skin surface. Vascular/Lymphatic: Abdominal aortic atherosclerosis without aneurysm. No enlarged lymph nodes. Reproductive: Brachytherapy seeds in the prostate. Other: No intraperitoneal free fluid. No abdominal wall mass or hernia. Musculoskeletal: Remote L1 compression fracture status post augmentation. T12  vertebral fracture is nonacute given presence on the prior lumbar spine radiographs, however there is an unhealed horizontal cleft extending completely through the vertebral body without definite posterior element involvement. There is 55% vertebral body height loss, and retropulsion of the posterosuperior vertebral body by 8 mm results in mild-to-moderate spinal stenosis. Intramedullary right and proximal screw are partially visualized in the right femur. IMPRESSION: 1. Anterior rectal perforation with stool extending into the region of the prostate. Suspected communication with the bladder as well given stool-like debris in the bladder. This could potentially reflect perforation from radiation proctitis related to prostate brachytherapy. 2. Colonic diverticulosis without evidence of diverticulitis. 3. Moderate pleural effusions. 4. Nonacute but unhealed L1 vertebral fracture. These results were called by telephone at the time of interpretation on 11/18/2016 at 4:35 pm to Dr. Mart Piggs , who verbally acknowledged these results. Electronically Signed   By: Logan Bores M.D.   On: 11/18/2016 16:40    Microbiology: Recent Results (from the past 240 hour(s))  MRSA PCR Screening     Status: None   Collection Time: 11/18/16  2:40 AM  Result Value Ref Range Status   MRSA by PCR NEGATIVE NEGATIVE Final    Comment:        The GeneXpert MRSA Assay (FDA approved for NASAL specimens only), is one component of a comprehensive MRSA colonization surveillance program. It is not intended to diagnose MRSA infection nor to guide or monitor treatment for MRSA infections.   Urine Culture     Status: Abnormal   Collection Time: 11/19/16  7:00 AM  Result Value Ref Range Status   Specimen Description URINE, SUPRAPUBIC  Final   Special Requests NONE  Final   Culture (A)  Final    >=100,000 COLONIES/mL PSEUDOMONAS AERUGINOSA 50,000 COLONIES/mL KLEBSIELLA PNEUMONIAE 50,000 COLONIES/mL CITROBACTER FREUNDII     Report Status 11/22/2016 FINAL  Final   Organism ID, Bacteria PSEUDOMONAS AERUGINOSA (A)  Final   Organism ID, Bacteria KLEBSIELLA PNEUMONIAE (A)  Final   Organism ID, Bacteria CITROBACTER FREUNDII (A)  Final      Susceptibility   Citrobacter freundii - MIC*    CEFAZOLIN >=64 RESISTANT Resistant     CEFTRIAXONE >=64 RESISTANT Resistant     CIPROFLOXACIN 1 SENSITIVE Sensitive     GENTAMICIN <=1 SENSITIVE Sensitive     IMIPENEM 1 SENSITIVE Sensitive     NITROFURANTOIN <=16 SENSITIVE Sensitive     TRIMETH/SULFA >=320 RESISTANT Resistant     PIP/TAZO >=128 RESISTANT Resistant     * 50,000 COLONIES/mL CITROBACTER FREUNDII   Klebsiella pneumoniae - MIC*    AMPICILLIN >=32 RESISTANT Resistant     CEFAZOLIN <=4 SENSITIVE Sensitive     CEFTRIAXONE <=1 SENSITIVE Sensitive     CIPROFLOXACIN <=0.25 SENSITIVE Sensitive  GENTAMICIN <=1 SENSITIVE Sensitive     IMIPENEM <=0.25 SENSITIVE Sensitive     NITROFURANTOIN 64 INTERMEDIATE Intermediate     TRIMETH/SULFA <=20 SENSITIVE Sensitive     AMPICILLIN/SULBACTAM 8 SENSITIVE Sensitive     PIP/TAZO <=4 SENSITIVE Sensitive     Extended ESBL NEGATIVE Sensitive     * 50,000 COLONIES/mL KLEBSIELLA PNEUMONIAE   Pseudomonas aeruginosa - MIC*    CEFTAZIDIME 8 SENSITIVE Sensitive     CIPROFLOXACIN >=4 RESISTANT Resistant     GENTAMICIN 8 INTERMEDIATE Intermediate     IMIPENEM 1 SENSITIVE Sensitive     PIP/TAZO 32 SENSITIVE Sensitive     CEFEPIME 16 INTERMEDIATE Intermediate     CEFAZOLIN <=4      * >=100,000 COLONIES/mL PSEUDOMONAS AERUGINOSA     Labs: Basic Metabolic Panel:  Recent Labs Lab 11/17/16 2307 11/18/16 0312 11/19/16 1122 11/20/16 0442 11/21/16 0439  NA 142 143 142 145 147*  K 4.7 4.3 5.0 4.7 4.4  CL 106 111 110 113* 114*  CO2 25 26 21* 24 20*  GLUCOSE 118* 113* 121* 90 78  BUN 44* 46* 58* 68* 61*  CREATININE 1.39* 1.30* 1.65* 1.76* 1.73*  CALCIUM 8.9 8.4* 7.9* 8.1* 8.1*   Liver Function Tests:  Recent Labs Lab  11/17/16 2307  AST 23  ALT 9*  ALKPHOS 79  BILITOT 0.8  PROT 7.0  ALBUMIN 3.0*   CBC:  Recent Labs Lab 11/17/16 2307  11/18/16 1007 11/18/16 1423 11/19/16 1122 11/20/16 0442 11/21/16 0439  WBC 10.8*  < > 11.5* 12.0* 14.0* 10.6* 7.9  NEUTROABS 8.2*  --   --   --   --   --   --   HGB 11.0*  < > 9.0* 7.7* 9.4* 8.8* 8.4*  HCT 33.5*  < > 27.5* 23.4* 28.6* 27.3* 25.9*  MCV 91.0  < > 92.3 92.5 88.5 90.1 91.5  PLT 255  < > 239 199 191 189 210  < > = values in this interval not displayed.   Signed:  Barton Dubois MD.  [facility] 11/24/2016, 10:45 AM

## 2016-11-24 NOTE — Progress Notes (Signed)
[facility] - [facility] - GIP RN visit at 0800 am  This is a related and covered GIP admission of 11/18/16 with [facility] diagnosis of Protein Calorie Malnutrition, per Dr. Tomasa Hosteller. Patient has Mark Velasquez DNR. EMS initiated by [facility], after it was noticed that patient was bleeding from rectum. Patient had pulled out his catheter earlier, but it had been replaced. [facility] RN was notified and went to facility.  Visited patient at bedside. Pt moving head back and forth and eyes open but does not respond to voice or touch.   Pt is currently receiving 0.9 % sodium chloride infusion Rate: 20 mL/hr IV. He did receive 2 doses of Morphine 5 mg po on 11/23/16 but has not received a dose since 1557.  Notified Dr. Dyann Kief of [facility] bed availability. Met with Quita Skye in the room who agrees to transfer patient today and spoke with Genoveva Ill by phone to confirm.  Family is agreeable to transfer today. Have placed phone call to CSW and left message for return call. Dr. Orpah Melter to assume care per family request.  Discharge summary faxed to [phone].  RN please call report to [phone].  [place] called [facility] for transport at 1100. Junie Panning, [place] notified.  Please call for hospice related questions and concerns.  Thank you, Margaretmary Eddy, RN, [facility] Liaison [phone]  [place] are on AMION.

## 2016-12-22 DEATH — deceased

## 2017-06-10 IMAGING — DX DG LUMBAR SPINE COMPLETE 4+V
5 series · 5 of 5 positions shown · non-contrast
Comparison: 04/11/2009 CT.

CLINICAL DATA: Low back pain following fall 3 days ago. Initial
encounter.

EXAM:
LUMBAR SPINE - COMPLETE 4+ VIEW

[l-spine ap]
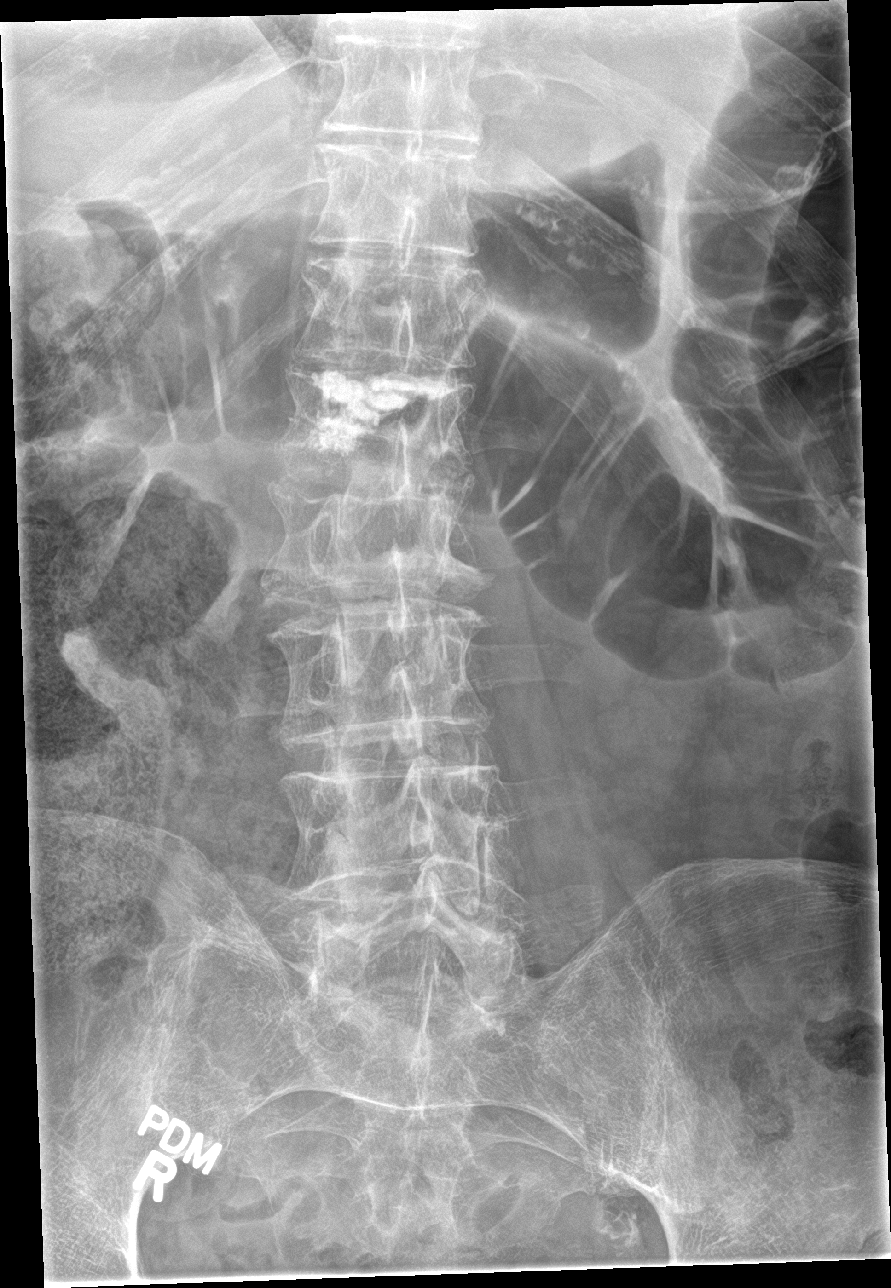

[l-spine obl (1 of 2)]
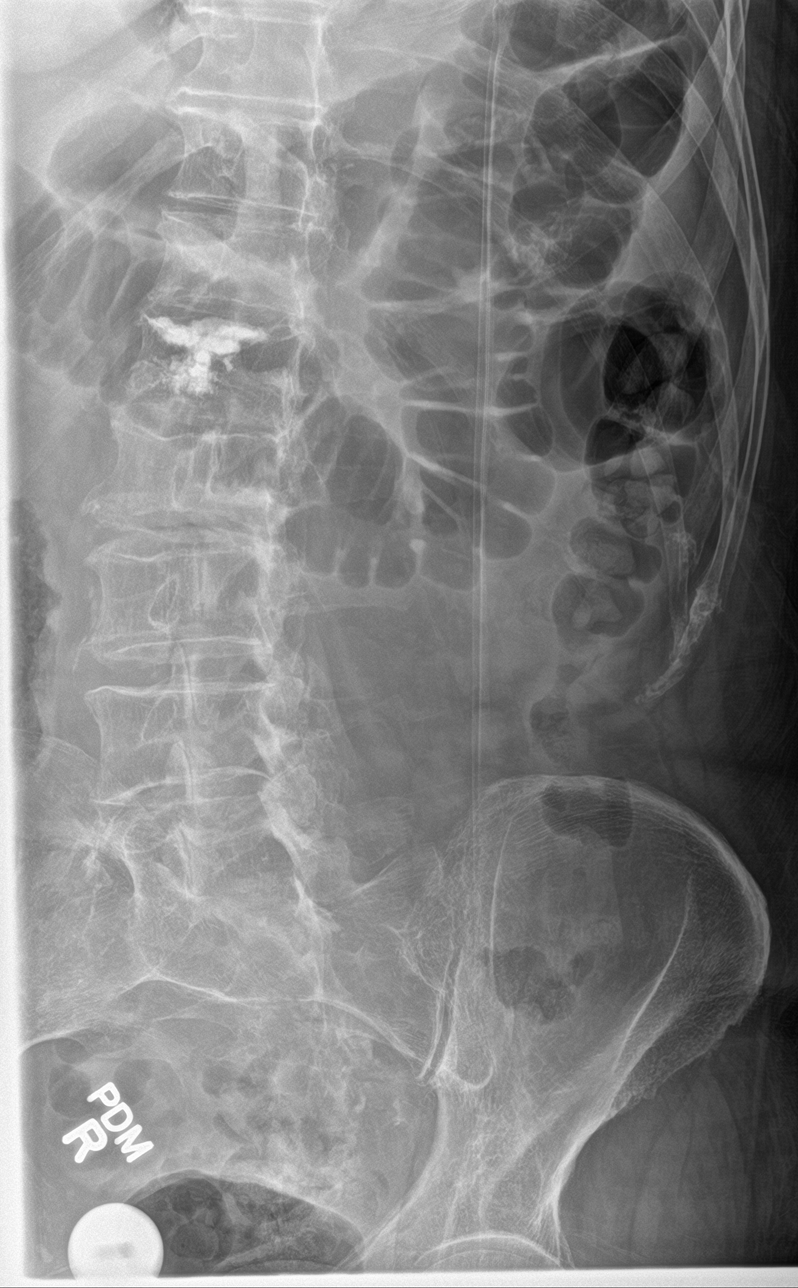

[l-spine obl (2 of 2)]
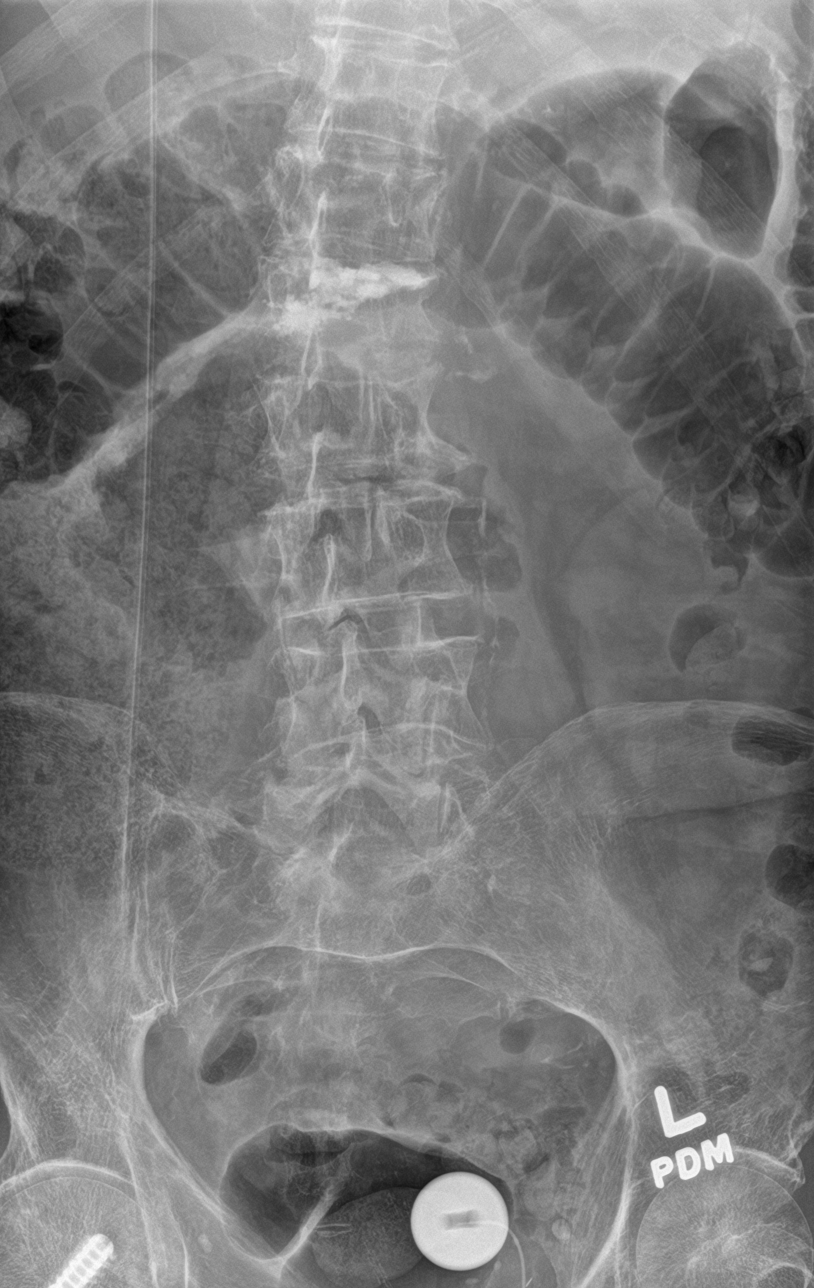

[l-spine lat]
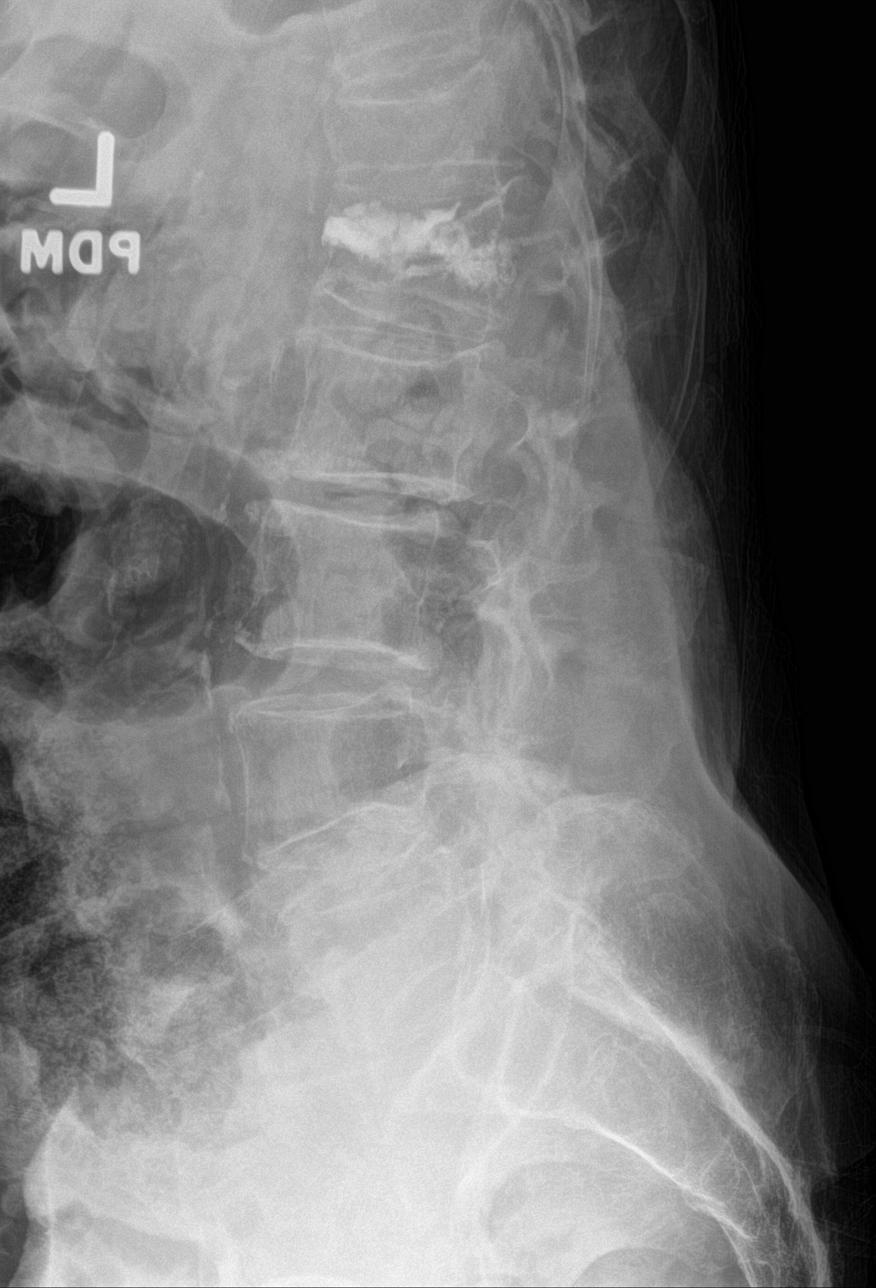

[l-spine spot]
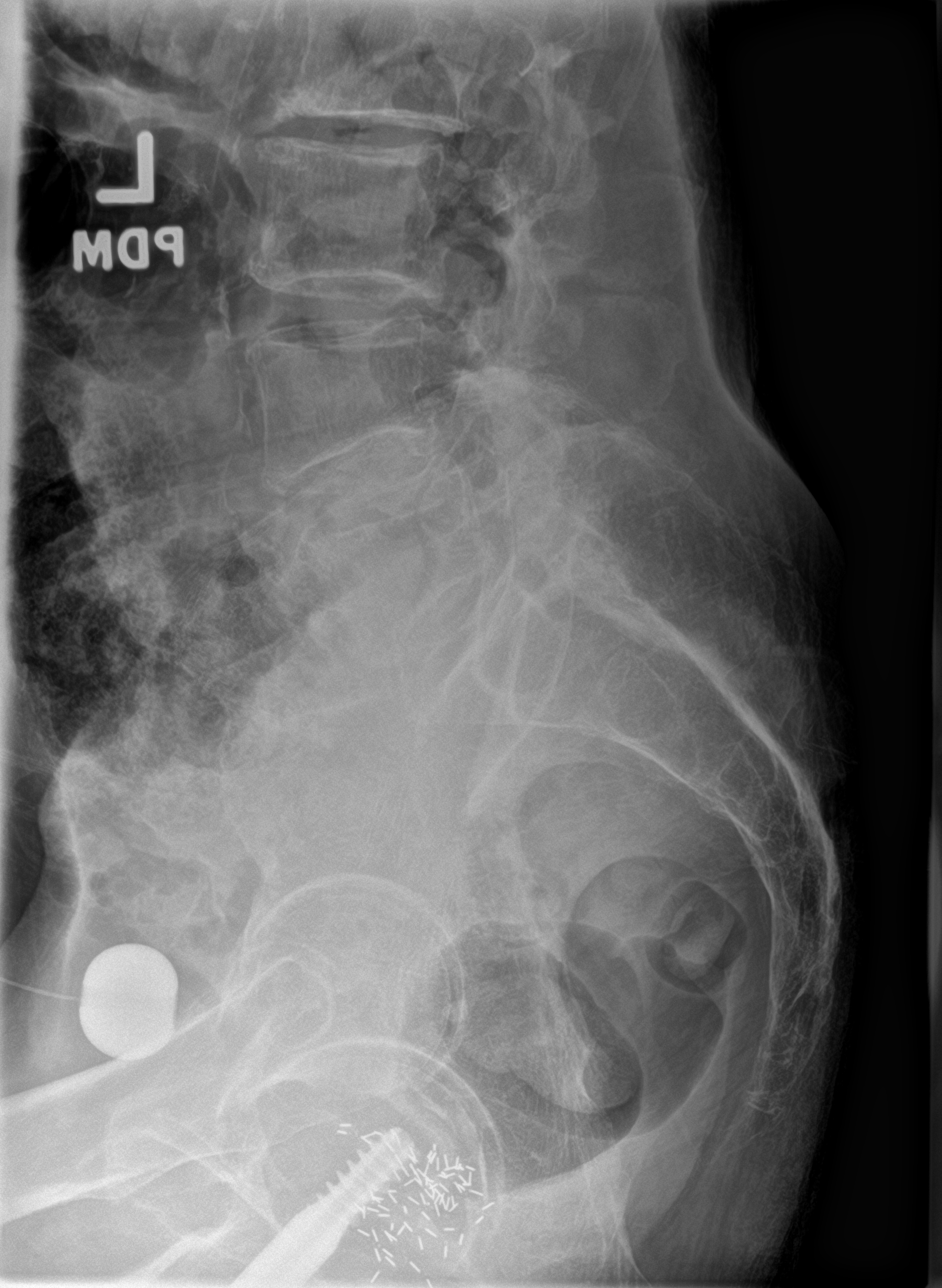

[5 of 5 positions shown; findings below may reference images not displayed]

FINDINGS: Remote compression fracture and vertebral augmentation changes of L1
again noted.

No acute fracture or subluxation identified.

Mild multilevel degenerative disc disease and spondylosis noted.

No focal bony lesions are noted.
IMPRESSION: No evidence of acute abnormality.

Remote L1 compression fracture and vertebral augmentation changes
